# Patient Record
Sex: Female | Born: 1950 | Race: White | Hispanic: No | Marital: Married | Smoking: Never smoker
Health system: Southern US, Community
[De-identification: ages and names within clinical notes are randomized; demographics above are authoritative.]

## PROBLEM LIST (undated history)

## (undated) DIAGNOSIS — E079 Disorder of thyroid, unspecified: Secondary | ICD-10-CM

## (undated) DIAGNOSIS — M069 Rheumatoid arthritis, unspecified: Secondary | ICD-10-CM

## (undated) DIAGNOSIS — M797 Fibromyalgia: Secondary | ICD-10-CM

## (undated) HISTORY — PX: TONSILLECTOMY: SUR1361

## (undated) HISTORY — PX: REPLACEMENT TOTAL KNEE: SUR1224

## (undated) HISTORY — PX: ABDOMINAL HYSTERECTOMY: SHX81

## (undated) HISTORY — PX: TYMPANOSTOMY TUBE PLACEMENT: SHX32

## (undated) HISTORY — DX: Disorder of thyroid, unspecified: E07.9

## (undated) HISTORY — PX: CHOLECYSTECTOMY: SHX55

---

## 2015-09-13 ENCOUNTER — Encounter: Payer: Self-pay | Admitting: Family Medicine

## 2015-09-13 ENCOUNTER — Ambulatory Visit (INDEPENDENT_AMBULATORY_CARE_PROVIDER_SITE_OTHER): Payer: BLUE CROSS/BLUE SHIELD | Admitting: Family Medicine

## 2015-09-13 VITALS — BP 121/86 | HR 67 | Wt 218.0 lb

## 2015-09-13 DIAGNOSIS — Z7952 Long term (current) use of systemic steroids: Secondary | ICD-10-CM | POA: Insufficient documentation

## 2015-09-13 DIAGNOSIS — D848 Other specified immunodeficiencies: Secondary | ICD-10-CM

## 2015-09-13 DIAGNOSIS — Z23 Encounter for immunization: Secondary | ICD-10-CM

## 2015-09-13 DIAGNOSIS — M069 Rheumatoid arthritis, unspecified: Secondary | ICD-10-CM | POA: Diagnosis not present

## 2015-09-13 DIAGNOSIS — M797 Fibromyalgia: Secondary | ICD-10-CM

## 2015-09-13 DIAGNOSIS — E538 Deficiency of other specified B group vitamins: Secondary | ICD-10-CM | POA: Insufficient documentation

## 2015-09-13 DIAGNOSIS — Z90710 Acquired absence of both cervix and uterus: Secondary | ICD-10-CM

## 2015-09-13 DIAGNOSIS — Z780 Asymptomatic menopausal state: Secondary | ICD-10-CM | POA: Diagnosis not present

## 2015-09-13 DIAGNOSIS — T380X5A Adverse effect of glucocorticoids and synthetic analogues, initial encounter: Secondary | ICD-10-CM | POA: Insufficient documentation

## 2015-09-13 DIAGNOSIS — E669 Obesity, unspecified: Secondary | ICD-10-CM | POA: Diagnosis not present

## 2015-09-13 DIAGNOSIS — E039 Hypothyroidism, unspecified: Secondary | ICD-10-CM

## 2015-09-13 NOTE — Assessment & Plan Note (Signed)
Continue current regimen is as guided by rheumatology

## 2015-09-13 NOTE — Assessment & Plan Note (Signed)
Continue current regimen

## 2015-09-13 NOTE — Assessment & Plan Note (Signed)
Check B12 

## 2015-09-13 NOTE — Assessment & Plan Note (Signed)
Bone density screening ordered

## 2015-09-13 NOTE — Assessment & Plan Note (Signed)
Check TSH 

## 2015-09-13 NOTE — Progress Notes (Signed)
Christina Ayala is a 64 y.o. female who presents to [facility]: [facility]  today for establish care and discuss chronic issues. Despite her multiple issues listed below [name] feels well and has no active medical problems today.  1) rheumatoid arthritis: Patient has chronic rheumatoid arthritis controlled with the below regimen. She receives all of her medications listed below per rheumatology and [facility]. She feels reasonably well controlled with minimal flares.  2) fibromyalgia: Patient is febrile mouth is typically well controlled with the regimen listed below. She denies significant fibromyalgia flares.  3) hypothyroidism: Well controlled with Synthroid  4) vitamin B-12 deficiency currently controlled  5) obesity: Due to chronic steroids. Patient is attempting to exercise.  Health maintenance:  Patient is due for Tdap vaccine today Patient states that she cannot take influenza or varicella vaccines as she is allergic to eggs. Additionally patient declines mammograms because she finds them uncomfortable and is not interested in this screening test at this time. She is interested in bone density screening however.   Past Medical History  Diagnosis Date  . Thyroid disease    History reviewed. No pertinent past surgical history. Social History  Substance Use Topics  . Smoking status: Never Smoker   . Smokeless tobacco: Not on file  . Alcohol Use: 0.0 oz/week    0 Standard drinks or equivalent per week   family history includes Depression in her brother; Diabetes in her maternal uncle and paternal aunt.  ROS as above Medications: Current Outpatient Prescriptions  Medication Sig Dispense Refill  . Certolizumab Pegol (CIMZIA PREFILLED) 2 X 200 MG/ML KIT Inject 400 mg into the skin.    . DULoxetine (CYMBALTA) 30 MG capsule Take 60 mg by mouth.    . folic acid (FOLVITE) 1 MG tablet Take 1 mg by mouth.    Marland Kitchen HYDROcodone-acetaminophen (NORCO/VICODIN)  5-325 MG tablet Take 0.5 tablets by mouth.    . hydroxychloroquine (PLAQUENIL) 200 MG tablet Take 200 mg by mouth.    . leflunomide (ARAVA) 20 MG tablet Take 20 mg by mouth.    . levothyroxine (SYNTHROID, LEVOTHROID) 175 MCG tablet Take 175 mcg by mouth.    . methylPREDNISolone (MEDROL) 2 MG tablet Take 2 mg by mouth.    . Multiple Vitamins-Minerals (MULTIVITAMIN WITH MINERALS) tablet Take 1 tablet by mouth.    . traMADol (ULTRAM) 50 MG tablet Take 25 mg by mouth.    . traZODone (DESYREL) 100 MG tablet Take 100 mg by mouth.    . vitamin B-12 (CYANOCOBALAMIN) 500 MCG tablet Take 500 mcg by mouth.     No current facility-administered medications for this visit.   Allergies  Allergen Reactions  . Eggs Or Egg-Derived Products   . Penicillins Nausea And Vomiting    Can take keflex  . Percocet [Oxycodone-Acetaminophen] Other (See Comments)    Mood changes, vision changes.   . Sulfa Antibiotics Nausea And Vomiting  . Erythromycin Base Nausea Only     Exam:  BP 121/86 mmHg  Pulse 67  Wt 218 lb (98.884 kg) Gen: Well NAD obese HEENT: EOMI,  MMM Lungs: Normal work of breathing. CTABL Heart: RRR no MRG Abd: NABS, Soft. Nondistended, Nontender Exts: Brisk capillary refill, warm and well perfused.  MSK: No hand ulnar deviation or significant synovitis.  No results found for this or any previous visit (from the past 24 hour(s)). No results found.   Please see individual assessment and plan sections.

## 2015-09-13 NOTE — Assessment & Plan Note (Signed)
Likely due to steroids. Work on weight loss at future visits.

## 2015-09-13 NOTE — Patient Instructions (Signed)
Thank you for coming in today. Call or go to the emergency room if you get worse, have trouble breathing, have chest pains, or palpitations.  My staff will call with results.

## 2015-09-13 NOTE — Assessment & Plan Note (Signed)
Not a candidate for Pap smears

## 2015-09-14 ENCOUNTER — Ambulatory Visit (INDEPENDENT_AMBULATORY_CARE_PROVIDER_SITE_OTHER): Payer: BLUE CROSS/BLUE SHIELD

## 2015-09-14 DIAGNOSIS — M8580 Other specified disorders of bone density and structure, unspecified site: Secondary | ICD-10-CM | POA: Diagnosis not present

## 2015-09-14 DIAGNOSIS — Z780 Asymptomatic menopausal state: Secondary | ICD-10-CM

## 2015-09-15 ENCOUNTER — Encounter: Payer: Self-pay | Admitting: Family Medicine

## 2015-09-15 DIAGNOSIS — M8580 Other specified disorders of bone density and structure, unspecified site: Secondary | ICD-10-CM | POA: Insufficient documentation

## 2015-09-15 LAB — LIPID PANEL
CHOLESTEROL: 175 mg/dL (ref 125–200)
HDL: 46 mg/dL (ref >=46)
LDL CALC: 83 mg/dL (ref <130)
Total CHOL/HDL Ratio: 3.8 Ratio (ref <=5.0)
Triglycerides: 229 mg/dL — ABNORMAL HIGH (ref <150)
VLDL: 46 mg/dL — AB (ref <30)

## 2015-09-15 LAB — TSH: TSH: 2.08 u[IU]/mL (ref 0.350–4.500)

## 2015-09-15 LAB — VITAMIN B12: VITAMIN B 12: 1410 pg/mL — AB (ref 211–911)

## 2015-09-15 NOTE — Progress Notes (Signed)
Quick Note:  Bone density test shows osteopenia this is a precursor of osteoporosis. Take OTC vitD and Calcium daily to prevent osteoporosis. ______

## 2015-09-16 LAB — VITAMIN D 25 HYDROXY (VIT D DEFICIENCY, FRACTURES): VIT D 25 HYDROXY: 19 ng/mL — AB (ref 30–100)

## 2015-09-18 NOTE — Progress Notes (Signed)
Quick Note:  1) Vit D is low. Will send in a Rx for Vit D pills.  Cholesterol is mildly high. Work on diet and exercise. ______

## 2015-09-20 MED ORDER — VITAMIN D (ERGOCALCIFEROL) 1.25 MG (50000 UNIT) PO CAPS: 50000.0000 [IU] | ORAL_CAPSULE | ORAL | Status: DC

## 2015-09-20 NOTE — Addendum Note (Signed)
Addended by: Minna Antis T on: 09/20/2015 08:52 AM   Modules accepted: Orders

## 2015-10-26 ENCOUNTER — Ambulatory Visit (INDEPENDENT_AMBULATORY_CARE_PROVIDER_SITE_OTHER): Payer: BLUE CROSS/BLUE SHIELD | Admitting: Family Medicine

## 2015-10-26 ENCOUNTER — Encounter: Payer: Self-pay | Admitting: Family Medicine

## 2015-10-26 VITALS — BP 139/69 | HR 76 | Temp 98.1°F | Wt 228.0 lb

## 2015-10-26 DIAGNOSIS — T380X1A Poisoning by glucocorticoids and synthetic analogues, accidental (unintentional), initial encounter: Secondary | ICD-10-CM

## 2015-10-26 DIAGNOSIS — J069 Acute upper respiratory infection, unspecified: Secondary | ICD-10-CM | POA: Diagnosis not present

## 2015-10-26 DIAGNOSIS — D848 Other specified immunodeficiencies: Secondary | ICD-10-CM | POA: Diagnosis not present

## 2015-10-26 DIAGNOSIS — Z7952 Long term (current) use of systemic steroids: Secondary | ICD-10-CM

## 2015-10-26 MED ORDER — FLUTICASONE PROPIONATE 50 MCG/ACT NA SUSP: 2.0000 | Freq: Every day | NASAL | Status: DC

## 2015-10-26 MED ORDER — IPRATROPIUM BROMIDE 0.06 % NA SOLN: 2.0000 | Freq: Four times a day (QID) | NASAL | Status: DC

## 2015-10-26 NOTE — Assessment & Plan Note (Signed)
Symptoms most likely related to viral URI with some eustachian tube dysfunction. Given her history and relative immunocompromise status will obtain CBC and metabolic panel. Additionally we'll obtain Epstein-Barr antibody and PCR panel. Treat empirically with Tylenol, Flonase nasal spray and Atrovent nasal spray. Return as needed.

## 2015-10-26 NOTE — Progress Notes (Signed)
Christina Ayala is a 64 y.o. female who presents to [facility]: [facility] today for cough congestion and runny nose and ear pain. Symptoms present for 2 weeks. Aside from the ear pain her symptoms are improving. Ear pain and pressure started a few days ago and her mild to moderate. She had initial fevers and chills which have since resolved. No vomiting or diarrhea. She has a history of rheumatoid arthritis currently managed with Plaquenil, and methylprednisolone, methotrexate as well as other anti-rheumatics. 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 She states that she's had mononucleosis in the past with recurrent flareup episodes most recently last year. She's concerned that her current symptoms are due to a recurrence of mononucleosis.   Past Medical History  Diagnosis Date  . Thyroid disease    No past surgical history on file. Social History  Substance Use Topics  . Smoking status: Never Smoker   . Smokeless tobacco: Not on file  . Alcohol Use: 0.0 oz/week    0 Standard drinks or equivalent per week   family history includes Depression in her brother; Diabetes in her maternal uncle and paternal aunt.  ROS as above Medications: Current Outpatient Prescriptions  Medication Sig Dispense Refill  . Certolizumab Pegol (CIMZIA PREFILLED) 2 X 200 MG/ML KIT Inject 400 mg into the skin.    . DULoxetine (CYMBALTA) 30 MG capsule Take 60 mg by mouth.    . folic acid (FOLVITE) 1 MG tablet Take 1 mg by mouth.    Marland Kitchen HYDROcodone-acetaminophen (NORCO/VICODIN) 5-325 MG tablet Take 0.5 tablets by mouth.    . hydroxychloroquine (PLAQUENIL) 200 MG tablet Take 200 mg by mouth.    . leflunomide (ARAVA) 20 MG tablet Take 20 mg by mouth.    . levothyroxine (SYNTHROID, LEVOTHROID) 175 MCG tablet Take 175 mcg by mouth.    . methylPREDNISolone (MEDROL) 2 MG tablet Take 2 mg by mouth.    . Multiple Vitamins-Minerals  (MULTIVITAMIN WITH MINERALS) tablet Take 1 tablet by mouth.    . traMADol (ULTRAM) 50 MG tablet Take 25 mg by mouth.    . traZODone (DESYREL) 100 MG tablet Take 100 mg by mouth.    . vitamin B-12 (CYANOCOBALAMIN) 500 MCG tablet Take 500 mcg by mouth.    . Vitamin D, Ergocalciferol, (DRISDOL) 50000 UNITS CAPS capsule Take 1 capsule (50,000 Units total) by mouth every 7 (seven) days. 12 capsule 0  . fluticasone (FLONASE) 50 MCG/ACT nasal spray Place 2 sprays into both nostrils daily. 16 g 2  . ipratropium (ATROVENT) 0.06 % nasal spray Place 2 sprays into both nostrils 4 (four) times daily. 15 mL 1   No current facility-administered medications for this visit.   Allergies  Allergen Reactions  . Eggs Or Egg-Derived Products   . Penicillins Nausea And Vomiting    Can take keflex  . Percocet [Oxycodone-Acetaminophen] Other (See Comments)    Mood changes, vision changes.   . Sulfa Antibiotics Nausea And Vomiting  . Erythromycin Base Nausea Only     Exam:  BP 139/69 mmHg  Pulse 76  Temp(Src) 98.1 F (36.7 C) (Oral)  Wt 228 lb (103.42 kg)  SpO2 100% Gen: Well NAD nontoxic-appearing woman HEENT: EOMI,  MMM posterior pharynx with mild cobblestoning. Normal nasal turbinates. Tympanic membranes are retracted bilaterally but not erythematous. Mastoids are nontender. No significant cervical lymphadenopathy present. Lungs: Normal work of breathing. CTABL Heart: RRR no MRG Abd: NABS, Soft. Nondistended, Nontender Exts: Brisk capillary refill, warm and  well perfused.   No results found for this or any previous visit (from the past 24 hour(s)). No results found.   Please see individual assessment and plan sections.

## 2015-10-26 NOTE — Patient Instructions (Signed)
Thank you for coming in today. Call or go to the emergency room if you get worse, have trouble breathing, have chest pains, or palpitations.  We will do blood work.  Use atrovent spray 4x daily for post nasal drip.  Use flonase spray 1-2 x daily for ear pressure.  Call or go to the emergency room if you get worse, have trouble breathing, have chest pains, or palpitations.

## 2015-10-27 LAB — CBC WITH DIFFERENTIAL/PLATELET
Basophils Absolute: 0.1 10*3/uL (ref 0.0–0.1)
Basophils Relative: 1 % (ref 0–1)
EOS ABS: 0.2 10*3/uL (ref 0.0–0.7)
EOS PCT: 2 % (ref 0–5)
HEMATOCRIT: 37.2 % (ref 36.0–46.0)
Hemoglobin: 12.3 g/dL (ref 12.0–15.0)
LYMPHS ABS: 1.9 10*3/uL (ref 0.7–4.0)
LYMPHS PCT: 25 % (ref 12–46)
MCH: 32.5 pg (ref 26.0–34.0)
MCHC: 33.1 g/dL (ref 30.0–36.0)
MCV: 98.4 fL (ref 78.0–100.0)
MONOS PCT: 11 % (ref 3–12)
MPV: 11.4 fL (ref 8.6–12.4)
Monocytes Absolute: 0.8 10*3/uL (ref 0.1–1.0)
Neutro Abs: 4.6 10*3/uL (ref 1.7–7.7)
Neutrophils Relative %: 61 % (ref 43–77)
PLATELETS: 205 10*3/uL (ref 150–400)
RBC: 3.78 MIL/uL — ABNORMAL LOW (ref 3.87–5.11)
RDW: 13.8 % (ref 11.5–15.5)
WBC: 7.5 10*3/uL (ref 4.0–10.5)

## 2015-10-27 LAB — COMPREHENSIVE METABOLIC PANEL
ALK PHOS: 61 U/L (ref 33–130)
ALT: 18 U/L (ref 6–29)
AST: 22 U/L (ref 10–35)
Albumin: 3.2 g/dL — ABNORMAL LOW (ref 3.6–5.1)
BUN: 20 mg/dL (ref 7–25)
CALCIUM: 8.2 mg/dL — AB (ref 8.6–10.4)
CHLORIDE: 107 mmol/L (ref 98–110)
CO2: 22 mmol/L (ref 20–31)
Creat: 0.69 mg/dL (ref 0.50–0.99)
Glucose, Bld: 93 mg/dL (ref 65–99)
POTASSIUM: 3.9 mmol/L (ref 3.5–5.3)
Sodium: 139 mmol/L (ref 135–146)
TOTAL PROTEIN: 5.4 g/dL — AB (ref 6.1–8.1)
Total Bilirubin: 0.3 mg/dL (ref 0.2–1.2)

## 2015-10-27 LAB — EPSTEIN-BARR VIRUS VCA ANTIBODY PANEL
EBV EA IGG: 17.7 U/mL — AB (ref <9.0)
EBV NA IgG: 135 U/mL — ABNORMAL HIGH (ref <18.0)
EBV VCA IGM: 10.7 U/mL (ref <36.0)
EBV VCA IgG: 92.4 U/mL — ABNORMAL HIGH (ref <18.0)

## 2015-10-30 LAB — EPSTEIN BARR VIRUS DNA, QUANT RTPCR

## 2015-10-30 NOTE — Progress Notes (Signed)
Quick Note:  No evidence of recurrent EBV/Mono infection. ______

## 2016-01-15 DIAGNOSIS — Z79899 Other long term (current) drug therapy: Secondary | ICD-10-CM | POA: Insufficient documentation

## 2016-02-26 ENCOUNTER — Ambulatory Visit (INDEPENDENT_AMBULATORY_CARE_PROVIDER_SITE_OTHER): Payer: BLUE CROSS/BLUE SHIELD | Admitting: Family Medicine

## 2016-02-26 ENCOUNTER — Encounter: Payer: Self-pay | Admitting: Family Medicine

## 2016-02-26 VITALS — BP 143/86 | HR 77 | Wt 232.0 lb

## 2016-02-26 DIAGNOSIS — M069 Rheumatoid arthritis, unspecified: Secondary | ICD-10-CM | POA: Diagnosis not present

## 2016-02-26 MED ORDER — METHYLPREDNISOLONE 2 MG PO TABS: 1.0000 mg | ORAL_TABLET | Freq: Every day | ORAL | Status: DC

## 2016-02-26 NOTE — Assessment & Plan Note (Signed)
Rheumatoid arthritis flare. Refill Medrol. Refer to [facility] rheumatology

## 2016-02-26 NOTE — Patient Instructions (Signed)
Thank you for coming in today. Restart Medrol.  You should hear from Rheumatology soon.  Return as needed.

## 2016-02-26 NOTE — Progress Notes (Signed)
       Christina Ayala is a 65 y.o. female who presents to [facility]: [facility] today for rheumatoid arthritis flare. Patient has a history of rheumatoid arthritis typically well controlled with Cimza Plaquenil and Medrol. Her existing rheumatologist left the practice and she was given a new rheumatologist. Patient notes that her Medrol was discontinued somewhat abruptly. Since then her symptoms have worsened. She notes pain in both shoulders both hands and the left knee. She would like a referral to a different rheumatology practice as she does not feel she was treated well.   Past Medical History  Diagnosis Date  . Thyroid disease    No past surgical history on file. Social History  Substance Use Topics  . Smoking status: Never Smoker   . Smokeless tobacco: Not on file  . Alcohol Use: 0.0 oz/week    0 Standard drinks or equivalent per week   family history includes Depression in her brother; Diabetes in her maternal uncle and paternal aunt.  ROS as above Medications: Current Outpatient Prescriptions  Medication Sig Dispense Refill  . Certolizumab Pegol (CIMZIA PREFILLED) 2 X 200 MG/ML KIT Inject 400 mg into the skin.    . DULoxetine (CYMBALTA) 30 MG capsule Take 60 mg by mouth.    . fluticasone (FLONASE) 50 MCG/ACT nasal spray Place 2 sprays into both nostrils daily. 16 g 2  . folic acid (FOLVITE) 1 MG tablet Take 1 mg by mouth.    Marland Kitchen HYDROcodone-acetaminophen (NORCO/VICODIN) 5-325 MG tablet Take 0.5 tablets by mouth.    . hydroxychloroquine (PLAQUENIL) 200 MG tablet Take 200 mg by mouth.    Marland Kitchen ipratropium (ATROVENT) 0.06 % nasal spray Place 2 sprays into both nostrils 4 (four) times daily. 15 mL 1  . leflunomide (ARAVA) 20 MG tablet Take 20 mg by mouth.    . levothyroxine (SYNTHROID, LEVOTHROID) 175 MCG tablet Take 175 mcg by mouth.    . methylPREDNISolone (MEDROL) 2 MG tablet Take 0.5 tablets (1  mg total) by mouth daily. 45 tablet 0  . Multiple Vitamins-Minerals (MULTIVITAMIN WITH MINERALS) tablet Take 1 tablet by mouth.    . traMADol (ULTRAM) 50 MG tablet Take 25 mg by mouth.    . traZODone (DESYREL) 100 MG tablet Take 100 mg by mouth.    . vitamin B-12 (CYANOCOBALAMIN) 500 MCG tablet Take 500 mcg by mouth.    . Vitamin D, Ergocalciferol, (DRISDOL) 50000 UNITS CAPS capsule Take 1 capsule (50,000 Units total) by mouth every 7 (seven) days. 12 capsule 0   No current facility-administered medications for this visit.   Allergies  Allergen Reactions  . Eggs Or Egg-Derived Products   . Penicillins Nausea And Vomiting    Can take keflex  . Percocet [Oxycodone-Acetaminophen] Other (See Comments)    Mood changes, vision changes.   . Sulfa Antibiotics Nausea And Vomiting  . Erythromycin Base Nausea Only     Exam:  BP 143/86 mmHg  Pulse 77  Wt 232 lb (105.235 kg) Gen: Well NAD HEENT: EOMI,  MMM Lungs: Normal work of breathing. CTABL Heart: RRR no MRG Abd: NABS, Soft. Nondistended, Nontender Exts: Brisk capillary refill, warm and well perfused.  Hands bilaterally have synovitis at the MCPs and are tender.  No results found for this or any previous visit (from the past 24 hour(s)). No results found.   Please see individual assessment and plan sections.

## 2016-03-26 DIAGNOSIS — M0609 Rheumatoid arthritis without rheumatoid factor, multiple sites: Secondary | ICD-10-CM | POA: Diagnosis not present

## 2016-03-26 DIAGNOSIS — E038 Other specified hypothyroidism: Secondary | ICD-10-CM | POA: Diagnosis not present

## 2016-03-26 LAB — CYCLIC CITRUL PEPTIDE ANTIBODY, IGG: CCP Antibodies IgG/IgA: 8

## 2016-03-26 LAB — CBC AND DIFFERENTIAL
HEMOGLOBIN: 12.4 g/dL (ref 12.0–16.0)
PLATELETS: 203 10*3/uL (ref 150–399)
WBC: 6.8 10^3/mL

## 2016-03-26 LAB — BASIC METABOLIC PANEL
Creatinine: 0.7 mg/dL (ref 0.5–1.1)
GLUCOSE: 92 mg/dL

## 2016-03-26 LAB — LATEX, IGE: LATEX: 10

## 2016-03-26 LAB — C-REACTIVE PROTEIN, QUANT: CRP: 8.4

## 2016-03-26 LAB — HEPATIC FUNCTION PANEL
ALT: 20 U/L (ref 7–35)
AST: 27 U/L (ref 13–35)

## 2016-04-02 ENCOUNTER — Encounter: Payer: Self-pay | Admitting: Family Medicine

## 2016-04-02 ENCOUNTER — Ambulatory Visit (INDEPENDENT_AMBULATORY_CARE_PROVIDER_SITE_OTHER): Payer: BLUE CROSS/BLUE SHIELD

## 2016-04-02 ENCOUNTER — Ambulatory Visit (INDEPENDENT_AMBULATORY_CARE_PROVIDER_SITE_OTHER): Payer: BLUE CROSS/BLUE SHIELD | Admitting: Family Medicine

## 2016-04-02 VITALS — BP 160/89 | HR 77 | Wt 225.0 lb

## 2016-04-02 DIAGNOSIS — Z96651 Presence of right artificial knee joint: Secondary | ICD-10-CM

## 2016-04-02 DIAGNOSIS — M25562 Pain in left knee: Secondary | ICD-10-CM

## 2016-04-02 DIAGNOSIS — M25561 Pain in right knee: Secondary | ICD-10-CM | POA: Diagnosis not present

## 2016-04-02 NOTE — Patient Instructions (Signed)
Thank you for coming in today. For a medicare well visit after your birthday.   Call or go to the ER if you develop a large red swollen joint with extreme pain or oozing puss.

## 2016-04-02 NOTE — Progress Notes (Signed)
Christina Ayala is a 65 y.o. female who presents to [facility]: [place] today for left knee pain. Patient has had gradual onset of left anterior posterior knee pain for the last week or so. She feels that her kneecap is subluxing laterally at times. She denies any falls or specific injury. No fevers or chills nausea vomiting or diarrhea. Her knee pain is complicated by rheumatoid arthritis and fibromyalgia. She has tried some over the counter medicines which did not help much.   Past Medical History  Diagnosis Date  . Thyroid disease    No past surgical history on file. Social History  Substance Use Topics  . Smoking status: Never Smoker   . Smokeless tobacco: Not on file  . Alcohol Use: 0.0 oz/week    0 Standard drinks or equivalent per week   family history includes Depression in her brother; Diabetes in her maternal uncle and paternal aunt.  ROS as above:  Medications: Current Outpatient Prescriptions  Medication Sig Dispense Refill  . Certolizumab Pegol (CIMZIA PREFILLED) 2 X 200 MG/ML KIT Inject 400 mg into the skin.    . DULoxetine (CYMBALTA) 30 MG capsule Take 60 mg by mouth.    . fluticasone (FLONASE) 50 MCG/ACT nasal spray Place 2 sprays into both nostrils daily. 16 g 2  . folic acid (FOLVITE) 1 MG tablet Take 1 mg by mouth.    Marland Kitchen HYDROcodone-acetaminophen (NORCO/VICODIN) 5-325 MG tablet Take 0.5 tablets by mouth.    . hydroxychloroquine (PLAQUENIL) 200 MG tablet Take 200 mg by mouth.    . levothyroxine (SYNTHROID, LEVOTHROID) 175 MCG tablet Take 175 mcg by mouth.    . methylPREDNISolone (MEDROL) 2 MG tablet Take 0.5 tablets (1 mg total) by mouth daily. 45 tablet 0  . Multiple Vitamins-Minerals (MULTIVITAMIN WITH MINERALS) tablet Take 1 tablet by mouth.    . traMADol (ULTRAM) 50 MG tablet Take 25 mg by mouth.    . traZODone (DESYREL) 100 MG tablet Take 100 mg by  mouth.    . vitamin B-12 (CYANOCOBALAMIN) 500 MCG tablet Take 500 mcg by mouth.     No current facility-administered medications for this visit.   Allergies  Allergen Reactions  . Eggs Or Egg-Derived Products   . Penicillins Nausea And Vomiting    Can take keflex  . Percocet [Oxycodone-Acetaminophen] Other (See Comments)    Mood changes, vision changes.   . Sulfa Antibiotics Nausea And Vomiting  . Erythromycin Base Nausea Only     Exam:  BP 160/89 mmHg  Pulse 77  Wt 225 lb (102.059 kg) Gen: Well NAD HEENT: EOMI,  MMM  Knee: Mild effusion present otherwise normal-appearing Normal motion but pain with extension. 1+ retropatellar crepitations. Neck slight diffusely tender. Stable ligaments exam. No patellar subluxation on exam.  Procedure: Real-time Ultrasound Guided Injection of Left Knee  Device: GE Logiq E  Images permanently stored and available for review in the ultrasound unit. Verbal informed consent obtained. Discussed risks and benefits of procedure. Warned about infection bleeding damage to structures skin hypopigmentation and fat atrophy among others. Patient expresses understanding and agreement Time-out conducted.  Noted no overlying erythema, induration, or other signs of local infection.  Skin prepped in a sterile fashion.  Local anesthesia: Topical Ethyl chloride.  With sterile technique and under real time ultrasound guidance: 63m Kenalog and 4 mL of Marcaine injected easily.  Completed without difficulty  Pain immediately resolved suggesting accurate placement of the medication.  Advised  to call if fevers/chills, erythema, induration, drainage, or persistent bleeding.  Images permanently stored and available for review in the ultrasound unit.  Impression: Technically successful ultrasound guided injection.    No results found for this or any previous visit (from the past 24 hour(s)). Dg Knee 1-2 Views Right  04/02/2016  CLINICAL DATA:  Left  knee pain and weakness for months. History of right knee replacement. EXAM: RIGHT KNEE - 1-2 VIEW COMPARISON:  Left knee performed today. FINDINGS: Changes of right knee replacement noted on the this AP view of the right knee. No visible hardware complicating feature or acute bony abnormality. Cannot assess for a joint effusion without the lateral view. : Right knee replacement. No visible complicating feature or acute bony abnormality. Electronically Signed   By: Rolm Baptise M.D.   On: 04/02/2016 14:34   Dg Knee Complete 4 Views Left  04/02/2016  CLINICAL DATA:  Left knee pain, weakness for months. Worsening the past few days. EXAM: LEFT KNEE - COMPLETE 4+ VIEW COMPARISON:  None. FINDINGS: There is joint space narrowing in the medial and patellofemoral compartments. Spurring in all 3 compartments. No joint effusion. No acute bony abnormality. Specifically, no fracture, subluxation, or dislocation. Soft tissues are intact. IMPRESSION: Mild-to-moderate degenerative changes, most pronounced in the medial and patellofemoral compartments. No acute bony abnormality. Electronically Signed   By: Rolm Baptise M.D.   On: 04/02/2016 14:34      Assessment and Plan: 65 y.o. 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 Dramatic improvement with steroid and Marcaine injection. Return in the near future for a Medicare wellness visit.  Discussed warning signs or symptoms. Please see discharge instructions. Patient expresses understanding.

## 2016-04-02 NOTE — Progress Notes (Signed)
Quick Note:  x-ray shows arthritis ______

## 2016-04-19 ENCOUNTER — Encounter: Payer: Self-pay | Admitting: Family Medicine

## 2016-04-30 DIAGNOSIS — M0609 Rheumatoid arthritis without rheumatoid factor, multiple sites: Secondary | ICD-10-CM | POA: Diagnosis not present

## 2016-05-06 ENCOUNTER — Telehealth: Payer: Self-pay | Admitting: *Deleted

## 2016-05-06 MED ORDER — LEVOTHYROXINE SODIUM 175 MCG PO TABS: 175.0000 ug | ORAL_TABLET | Freq: Every day | ORAL | Status: DC

## 2016-05-06 NOTE — Telephone Encounter (Signed)
Patient's spouse called requesting a new rx. Since this office has not yet rx'd this medication for this patient will have to rout this to the provider

## 2016-05-06 NOTE — Telephone Encounter (Signed)
TSH normal at last check refill levothyroxine.

## 2016-05-06 NOTE — Telephone Encounter (Signed)
Pt notified about rx

## 2016-05-30 DIAGNOSIS — M0609 Rheumatoid arthritis without rheumatoid factor, multiple sites: Secondary | ICD-10-CM | POA: Diagnosis not present

## 2016-05-30 DIAGNOSIS — M8580 Other specified disorders of bone density and structure, unspecified site: Secondary | ICD-10-CM | POA: Diagnosis not present

## 2016-05-30 DIAGNOSIS — M797 Fibromyalgia: Secondary | ICD-10-CM | POA: Diagnosis not present

## 2016-05-30 DIAGNOSIS — M150 Primary generalized (osteo)arthritis: Secondary | ICD-10-CM | POA: Diagnosis not present

## 2016-05-30 DIAGNOSIS — E038 Other specified hypothyroidism: Secondary | ICD-10-CM | POA: Diagnosis not present

## 2016-05-31 ENCOUNTER — Encounter: Payer: Self-pay | Admitting: Family Medicine

## 2016-05-31 DIAGNOSIS — M0609 Rheumatoid arthritis without rheumatoid factor, multiple sites: Secondary | ICD-10-CM | POA: Diagnosis not present

## 2016-06-28 DIAGNOSIS — M47812 Spondylosis without myelopathy or radiculopathy, cervical region: Secondary | ICD-10-CM | POA: Diagnosis not present

## 2016-06-28 DIAGNOSIS — G44209 Tension-type headache, unspecified, not intractable: Secondary | ICD-10-CM | POA: Diagnosis not present

## 2016-06-28 DIAGNOSIS — M62830 Muscle spasm of back: Secondary | ICD-10-CM | POA: Diagnosis not present

## 2016-06-28 DIAGNOSIS — M545 Low back pain: Secondary | ICD-10-CM | POA: Diagnosis not present

## 2016-07-02 DIAGNOSIS — M0609 Rheumatoid arthritis without rheumatoid factor, multiple sites: Secondary | ICD-10-CM | POA: Diagnosis not present

## 2016-07-04 ENCOUNTER — Encounter: Payer: Self-pay | Admitting: Family Medicine

## 2016-07-10 DIAGNOSIS — M8580 Other specified disorders of bone density and structure, unspecified site: Secondary | ICD-10-CM | POA: Diagnosis not present

## 2016-07-10 DIAGNOSIS — M0609 Rheumatoid arthritis without rheumatoid factor, multiple sites: Secondary | ICD-10-CM | POA: Diagnosis not present

## 2016-07-10 DIAGNOSIS — M797 Fibromyalgia: Secondary | ICD-10-CM | POA: Diagnosis not present

## 2016-07-10 DIAGNOSIS — M150 Primary generalized (osteo)arthritis: Secondary | ICD-10-CM | POA: Diagnosis not present

## 2016-08-02 DIAGNOSIS — M8580 Other specified disorders of bone density and structure, unspecified site: Secondary | ICD-10-CM | POA: Diagnosis not present

## 2016-08-02 DIAGNOSIS — M150 Primary generalized (osteo)arthritis: Secondary | ICD-10-CM | POA: Diagnosis not present

## 2016-08-02 DIAGNOSIS — M797 Fibromyalgia: Secondary | ICD-10-CM | POA: Diagnosis not present

## 2016-08-02 DIAGNOSIS — M0609 Rheumatoid arthritis without rheumatoid factor, multiple sites: Secondary | ICD-10-CM | POA: Diagnosis not present

## 2016-08-05 DIAGNOSIS — M0609 Rheumatoid arthritis without rheumatoid factor, multiple sites: Secondary | ICD-10-CM | POA: Diagnosis not present

## 2016-09-02 DIAGNOSIS — M0609 Rheumatoid arthritis without rheumatoid factor, multiple sites: Secondary | ICD-10-CM | POA: Diagnosis not present

## 2016-09-03 ENCOUNTER — Ambulatory Visit (INDEPENDENT_AMBULATORY_CARE_PROVIDER_SITE_OTHER): Payer: Medicare Other | Admitting: Family Medicine

## 2016-09-03 ENCOUNTER — Encounter: Payer: Self-pay | Admitting: Family Medicine

## 2016-09-03 VITALS — BP 116/52 | HR 89 | Wt 231.0 lb

## 2016-09-03 DIAGNOSIS — E538 Deficiency of other specified B group vitamins: Secondary | ICD-10-CM

## 2016-09-03 DIAGNOSIS — T7808XS Anaphylactic reaction due to eggs, sequela: Secondary | ICD-10-CM

## 2016-09-03 DIAGNOSIS — Z1159 Encounter for screening for other viral diseases: Secondary | ICD-10-CM

## 2016-09-03 DIAGNOSIS — R2689 Other abnormalities of gait and mobility: Secondary | ICD-10-CM | POA: Diagnosis not present

## 2016-09-03 DIAGNOSIS — R945 Abnormal results of liver function studies: Secondary | ICD-10-CM | POA: Diagnosis not present

## 2016-09-03 DIAGNOSIS — Z23 Encounter for immunization: Secondary | ICD-10-CM | POA: Diagnosis not present

## 2016-09-03 DIAGNOSIS — T7808XA Anaphylactic reaction due to eggs, initial encounter: Secondary | ICD-10-CM | POA: Insufficient documentation

## 2016-09-03 DIAGNOSIS — E039 Hypothyroidism, unspecified: Secondary | ICD-10-CM | POA: Diagnosis not present

## 2016-09-03 DIAGNOSIS — E559 Vitamin D deficiency, unspecified: Secondary | ICD-10-CM | POA: Diagnosis not present

## 2016-09-03 MED ORDER — EPINEPHRINE 0.3 MG/0.3ML IJ SOAJ: 0.3000 mg | Freq: Once | INTRAMUSCULAR | 1 refills | Status: AC

## 2016-09-03 MED ORDER — INFLUENZA VAC RECOMB HA (PF) IM SOLN: 0.5000 mL | Freq: Once | INTRAMUSCULAR | 0 refills | Status: AC

## 2016-09-03 NOTE — Patient Instructions (Signed)
Thank you for coming in today. Attend Physical Therapy.  Return in 1 month for follow up.  Get labs soon.  Call or go to the emergency room if you get worse, have trouble breathing, have chest pains, or palpitations.

## 2016-09-03 NOTE — Addendum Note (Signed)
Addended by: Minna Antis T on: 09/03/2016 03:55 PM   Modules accepted: Orders

## 2016-09-03 NOTE — Progress Notes (Signed)
Christina Ayala is a 65 y.o. female who presents to [place] today for discuss impaired balance and mild leg weakness, bruise on left forearm, catch up on health maintenance.  Impaired balance: Patient notes a several month history of subjectively decreased balance. She has not fallen but has had some near misses. She denies any dizziness or lightheadedness fevers or chills nausea or vomiting. She feels well otherwise. She has not changed any of her medications.  Bruise on left forearm: Patient denies any known injury or bug bite. She has a circular ring shaped bruise on her left forearm. It is nontender. She denies any fevers or chills nausea vomiting or diarrhea.  Health maintenance: Patient is due for the pneumonia vaccine, influenza vaccine, hepatitis screening.  She declines mammography.   Past Medical History:  Diagnosis Date  . Thyroid disease    No past surgical history on file. Social History  Substance Use Topics  . Smoking status: Never Smoker  . Smokeless tobacco: Not on file  . Alcohol use 0.0 oz/week   family history includes Depression in her brother; Diabetes in her maternal uncle and paternal aunt.  ROS as above:  Medications: Current Outpatient Prescriptions  Medication Sig Dispense Refill  . Certolizumab Pegol (CIMZIA PREFILLED) 2 X 200 MG/ML KIT Inject 400 mg into the skin.    . DULoxetine (CYMBALTA) 30 MG capsule Take 60 mg by mouth.    . EPINEPHrine 0.3 mg/0.3 mL IJ SOAJ injection Inject 0.3 mLs (0.3 mg total) into the muscle once. 1 Device 1  . fluticasone (FLONASE) 50 MCG/ACT nasal spray Place 2 sprays into both nostrils daily. 16 g 2  . folic acid (FOLVITE) 1 MG tablet Take 1 mg by mouth.    Marland Kitchen HYDROcodone-acetaminophen (NORCO/VICODIN) 5-325 MG tablet Take 0.5 tablets by mouth.    . hydroxychloroquine (PLAQUENIL) 200 MG tablet Take 200  mg by mouth.    . influenza vac recombinant HA trivalent (FLUBLOK) injection Inject 0.5 mLs into the muscle once. 0.5 mL 0  . levothyroxine (SYNTHROID, LEVOTHROID) 175 MCG tablet Take 1 tablet (175 mcg total) by mouth daily before breakfast. 90 tablet 2  . methylPREDNISolone (MEDROL) 2 MG tablet Take 0.5 tablets (1 mg total) by mouth daily. 45 tablet 0  . Multiple Vitamins-Minerals (MULTIVITAMIN WITH MINERALS) tablet Take 1 tablet by mouth.    . traMADol (ULTRAM) 50 MG tablet Take 25 mg by mouth.    . traZODone (DESYREL) 100 MG tablet Take 100 mg by mouth.    . vitamin B-12 (CYANOCOBALAMIN) 500 MCG tablet Take 500 mcg by mouth.     No current facility-administered medications for this visit.    Allergies  Allergen Reactions  . Other Anaphylaxis    portabella and cremini mushrooms.   . Eggs Or Egg-Derived Products   . Penicillins Nausea And Vomiting    Can take keflex  . Percocet [Oxycodone-Acetaminophen] Other (See Comments)    Mood changes, vision changes.   Marland Kitchen Spinach   . Sulfa Antibiotics Nausea And Vomiting  . Erythromycin Base Nausea Only    Health Maintenance Health Maintenance  Topic Date Due  . Hepatitis C Screening  05/09/1951  . HIV Screening  04/21/1966  . MAMMOGRAM  04/21/2001  . PNA vac Low Risk Adult (1 of 2 - PCV13) 04/21/2016  . INFLUENZA VACCINE  09/12/2049 (Originally 06/11/2016)  . ZOSTAVAX  09/12/2049 (Originally 04/22/2011)  . COLONOSCOPY  09/12/2018  . TETANUS/TDAP  09/12/2025  . DEXA SCAN  Completed     Exam:  BP (!) 116/52   Pulse 89   Wt 231 lb (104.8 kg)  Gen: Well NAD HEENT: EOMI,  MMM Lungs: Normal work of breathing. CTABL Heart: RRR no MRG Abd: NABS, Soft. Nondistended, Nontender Exts: Brisk capillary refill, warm and well perfused.  Left forearm: Circular ring shaped ecchymosis left forearm. Nontender. No erythema. Oral abrasion in the center portion of the ecchymotic area. Neuro: Alert and oriented normal speech thought process. Lower  extremity strength and sensation and proprioception are intact. Pulses are intact feet bilaterally.        No results found for this or any previous visit (from the past 72 hour(s)). No results found.    Assessment and Plan: 64 y.o. female with  Impaired balance: Unclear etiology. Plan for laboratory workup listed below along with referral to vestibular physical therapy for further evaluation and balance and strength training. Recheck in 1-2 months.  Bruise left forearm: The patient suffered a small abrasion with bruise to her left forearm. I don't think this reflects anything more serious. Plan for watchful waiting.  Health maintenance: Prevnar 13 given. Prescription for egg free fluid vaccine sent to pharmacy. Hepatitis C screening obtained. Patient declined mammography.  History of egg anaphylaxis: EpiPen prescription refilled.   Orders Placed This Encounter  Procedures  . Hepatitis C antibody  . CBC  . COMPLETE METABOLIC PANEL WITH GFR  . VITAMIN D 25 Hydroxy (Vit-D Deficiency, Fractures)  . Vitamin B12  . TSH  . T4, free  . T3, free  . CK  . Ambulatory referral to Physical Therapy    Referral Priority:   Routine    Referral Type:   Physical Medicine    Referral Reason:   Specialty Services Required    Requested Specialty:   Physical Therapy    Number of Visits Requested:   1    Discussed warning signs or symptoms. Please see discharge instructions. Patient expresses understanding.

## 2016-09-04 ENCOUNTER — Encounter: Payer: Self-pay | Admitting: Family Medicine

## 2016-09-04 DIAGNOSIS — R768 Other specified abnormal immunological findings in serum: Secondary | ICD-10-CM | POA: Insufficient documentation

## 2016-09-04 LAB — CBC
HCT: 39.1 % (ref 35.0–45.0)
Hemoglobin: 12.9 g/dL (ref 11.7–15.5)
MCH: 32.3 pg (ref 27.0–33.0)
MCHC: 33 g/dL (ref 32.0–36.0)
MCV: 97.8 fL (ref 80.0–100.0)
MPV: 12 fL (ref 7.5–12.5)
Platelets: 180 10*3/uL (ref 140–400)
RBC: 4 MIL/uL (ref 3.80–5.10)
RDW: 13.6 % (ref 11.0–15.0)
WBC: 6.3 10*3/uL (ref 3.8–10.8)

## 2016-09-04 LAB — HEPATITIS C ANTIBODY: HCV AB: REACTIVE — AB

## 2016-09-04 LAB — T4, FREE: FREE T4: 1.1 ng/dL (ref 0.8–1.8)

## 2016-09-04 LAB — COMPLETE METABOLIC PANEL WITH GFR
ALBUMIN: 3.5 g/dL — AB (ref 3.6–5.1)
ALK PHOS: 75 U/L (ref 33–130)
ALT: 20 U/L (ref 6–29)
AST: 25 U/L (ref 10–35)
BUN: 18 mg/dL (ref 7–25)
CALCIUM: 8.8 mg/dL (ref 8.6–10.4)
CHLORIDE: 107 mmol/L (ref 98–110)
CO2: 22 mmol/L (ref 20–31)
Creat: 0.72 mg/dL (ref 0.50–0.99)
GFR, EST NON AFRICAN AMERICAN: 88 mL/min (ref >=60)
Glucose, Bld: 101 mg/dL — ABNORMAL HIGH (ref 65–99)
POTASSIUM: 4 mmol/L (ref 3.5–5.3)
Sodium: 140 mmol/L (ref 135–146)
Total Bilirubin: 0.3 mg/dL (ref 0.2–1.2)
Total Protein: 5.7 g/dL — ABNORMAL LOW (ref 6.1–8.1)

## 2016-09-04 LAB — CK: Total CK: 93 U/L (ref 7–177)

## 2016-09-04 LAB — VITAMIN D 25 HYDROXY (VIT D DEFICIENCY, FRACTURES): VIT D 25 HYDROXY: 20 ng/mL — AB (ref 30–100)

## 2016-09-04 LAB — T3, FREE: T3 FREE: 2.8 pg/mL (ref 2.3–4.2)

## 2016-09-04 LAB — TSH: TSH: 0.26 m[IU]/L — AB

## 2016-09-04 LAB — VITAMIN B12: VITAMIN B 12: 1167 pg/mL — AB (ref 200–1100)

## 2016-09-06 LAB — HEPATITIS C RNA QUANTITATIVE: HCV QUANT: NOT DETECTED [IU]/mL (ref <15)

## 2016-09-13 ENCOUNTER — Ambulatory Visit: Payer: Medicare Other | Admitting: Rehabilitation

## 2016-09-17 ENCOUNTER — Ambulatory Visit: Payer: Medicare Other | Attending: Family Medicine | Admitting: Physical Therapy

## 2016-09-17 DIAGNOSIS — R2689 Other abnormalities of gait and mobility: Secondary | ICD-10-CM | POA: Insufficient documentation

## 2016-09-17 NOTE — Therapy (Signed)
[facility] [address] [place], [place], [address] Phone: [phone]   Fax:  [phone]  Patient Details  Name: Christina Ayala MRN: [id number] Date of Birth: 1951/01/21 Referring Provider:  Rodolph Bong, MD  Encounter Date: 09/17/2016   Pt arrived for PT evaluation today; however, upon subjective portion of evaluation being completed, patient reports she does not want to complete the PT eval, because she hates rehab, she doesn't think she needs to be here as her balance is not bad enough.  (This is after patient reporting that she had several near falls at a horse show in October and reporting that she doesn't feel comfortable walking without a cane).  PT explained benefits of PT evaluation and therapy process, but pt declines to stay for PT eval.  Tonyia Marschall W. 09/17/2016, 12:08 PM  Gean Maidens., PT  [facility] [address] [place], [place], [address] Phone: [phone]   Fax:  [phone]

## 2016-09-23 ENCOUNTER — Encounter: Payer: Self-pay | Admitting: Family Medicine

## 2016-09-30 DIAGNOSIS — M0609 Rheumatoid arthritis without rheumatoid factor, multiple sites: Secondary | ICD-10-CM | POA: Diagnosis not present

## 2016-10-17 DIAGNOSIS — M1990 Unspecified osteoarthritis, unspecified site: Secondary | ICD-10-CM | POA: Diagnosis not present

## 2016-10-17 DIAGNOSIS — Z886 Allergy status to analgesic agent status: Secondary | ICD-10-CM | POA: Diagnosis not present

## 2016-10-17 DIAGNOSIS — R509 Fever, unspecified: Secondary | ICD-10-CM | POA: Diagnosis not present

## 2016-10-17 DIAGNOSIS — R5383 Other fatigue: Secondary | ICD-10-CM | POA: Diagnosis not present

## 2016-10-17 DIAGNOSIS — Z880 Allergy status to penicillin: Secondary | ICD-10-CM | POA: Diagnosis not present

## 2016-10-17 DIAGNOSIS — E079 Disorder of thyroid, unspecified: Secondary | ICD-10-CM | POA: Diagnosis not present

## 2016-10-17 DIAGNOSIS — E669 Obesity, unspecified: Secondary | ICD-10-CM | POA: Diagnosis not present

## 2016-10-17 DIAGNOSIS — Z79899 Other long term (current) drug therapy: Secondary | ICD-10-CM | POA: Diagnosis not present

## 2016-10-17 DIAGNOSIS — R05 Cough: Secondary | ICD-10-CM | POA: Diagnosis not present

## 2016-10-17 DIAGNOSIS — Z883 Allergy status to other anti-infective agents status: Secondary | ICD-10-CM | POA: Diagnosis not present

## 2016-10-17 DIAGNOSIS — B349 Viral infection, unspecified: Secondary | ICD-10-CM | POA: Diagnosis not present

## 2016-10-17 DIAGNOSIS — Z6839 Body mass index (BMI) 39.0-39.9, adult: Secondary | ICD-10-CM | POA: Diagnosis not present

## 2016-10-17 DIAGNOSIS — Z882 Allergy status to sulfonamides status: Secondary | ICD-10-CM | POA: Diagnosis not present

## 2016-10-24 ENCOUNTER — Ambulatory Visit (INDEPENDENT_AMBULATORY_CARE_PROVIDER_SITE_OTHER): Payer: Medicare Other

## 2016-10-24 ENCOUNTER — Encounter: Payer: Self-pay | Admitting: Family Medicine

## 2016-10-24 ENCOUNTER — Ambulatory Visit (INDEPENDENT_AMBULATORY_CARE_PROVIDER_SITE_OTHER): Payer: Medicare Other | Admitting: Family Medicine

## 2016-10-24 VITALS — BP 150/91 | HR 94 | Temp 97.7°F | Wt 228.0 lb

## 2016-10-24 DIAGNOSIS — R0602 Shortness of breath: Secondary | ICD-10-CM | POA: Diagnosis not present

## 2016-10-24 DIAGNOSIS — I700 Atherosclerosis of aorta: Secondary | ICD-10-CM

## 2016-10-24 DIAGNOSIS — R05 Cough: Secondary | ICD-10-CM | POA: Diagnosis not present

## 2016-10-24 MED ORDER — AZITHROMYCIN 250 MG PO TABS: 250.0000 mg | ORAL_TABLET | Freq: Every day | ORAL | 0 refills | Status: DC

## 2016-10-24 MED ORDER — METHYLPREDNISOLONE 16 MG PO TABS: 16.0000 mg | ORAL_TABLET | Freq: Every day | ORAL | 0 refills | Status: DC

## 2016-10-24 MED ORDER — ALBUTEROL SULFATE (2.5 MG/3ML) 0.083% IN NEBU
2.5000 mg | INHALATION_SOLUTION | Freq: Once | RESPIRATORY_TRACT | Status: AC
Start: 2016-10-24 — End: 2016-10-24
  Administered 2016-10-24: 2.5 mg via RESPIRATORY_TRACT

## 2016-10-24 NOTE — Patient Instructions (Signed)
Thank you for coming in today. Take the higher dose of medrol.  USe azithromycin.  Get a chest xray.  Return if not better/  Call or go to the emergency room if you get worse, have trouble breathing, have chest pains, or palpitations.    Acute Bronchitis, Adult Acute bronchitis is sudden (acute) swelling of the air tubes (bronchi) in the lungs. Acute bronchitis causes these tubes to fill with mucus, which can make it hard to breathe. It can also cause coughing or wheezing. In adults, acute bronchitis usually goes away within 2 weeks. A cough caused by bronchitis may last up to 3 weeks. Smoking, allergies, and asthma can make the condition worse. Repeated episodes of bronchitis may cause further lung problems, such as chronic obstructive pulmonary disease (COPD). What are the causes? This condition can be caused by germs and by substances that irritate the lungs, including:  Cold and flu viruses. This condition is most often caused by the same virus that causes a cold.  Bacteria.  Exposure to tobacco smoke, dust, fumes, and air pollution. What increases the risk? This condition is more likely to develop in people who:  Have close contact with someone with acute bronchitis.  Are exposed to lung irritants, such as tobacco smoke, dust, fumes, and vapors.  Have a weak immune system.  Have a respiratory condition such as asthma. What are the signs or symptoms? Symptoms of this condition include:  A cough.  Coughing up clear, yellow, or green mucus.  Wheezing.  Chest congestion.  Shortness of breath.  A fever.  Body aches.  Chills.  A sore throat. How is this diagnosed? This condition is usually diagnosed with a physical exam. During the exam, your health care provider may order tests, such as chest X-rays, to rule out other conditions. He or she may also:  Test a sample of your mucus for bacterial infection.  Check the level of oxygen in your blood. This is done to  check for pneumonia.  Do a chest X-ray or lung function testing to rule out pneumonia and other conditions.  Perform blood tests. Your health care provider will also ask about your symptoms and medical history. How is this treated? Most cases of acute bronchitis clear up over time without treatment. Your health care provider may recommend:  Drinking more fluids. Drinking more makes your mucus thinner, which may make it easier to breathe.  Taking a medicine for a fever or cough.  Taking an antibiotic medicine.  Using an inhaler to help improve shortness of breath and to control a cough.  Using a cool mist vaporizer or humidifier to make it easier to breathe. Follow these instructions at home: Medicines  Take over-the-counter and prescription medicines only as told by your health care provider.  If you were prescribed an antibiotic, take it as told by your health care provider. Do not stop taking the antibiotic even if you start to feel better. General instructions  Get plenty of rest.  Drink enough fluids to keep your urine clear or pale yellow.  Avoid smoking and secondhand smoke. Exposure to cigarette smoke or irritating chemicals will make bronchitis worse. If you smoke and you need help quitting, ask your health care provider. Quitting smoking will help your lungs heal faster.  Use an inhaler, cool mist vaporizer, or humidifier as told by your health care provider.  Keep all follow-up visits as told by your health care provider. This is important. How is this prevented? To lower your  risk of getting this condition again:  Wash your hands often with soap and water. If soap and water are not available, use hand sanitizer.  Avoid contact with people who have cold symptoms.  Try not to touch your hands to your mouth, nose, or eyes.  Make sure to get the flu shot every year. Contact a health care provider if:  Your symptoms do not improve in 2 weeks of treatment. Get  help right away if:  You cough up blood.  You have chest pain.  You have severe shortness of breath.  You become dehydrated.  You faint or keep feeling like you are going to faint.  You keep vomiting.  You have a severe headache.  Your fever or chills gets worse. This information is not intended to replace advice given to you by your health care provider. Make sure you discuss any questions you have with your health care provider. Document Released: 12/05/2004 Document Revised: 05/22/2016 Document Reviewed: 04/17/2016 Elsevier Interactive Patient Education  2017 [employer].

## 2016-10-24 NOTE — Progress Notes (Signed)
Christina Ayala is a 65 y.o. female who presents to [facility] [place] today for shortness of breath cough and hoarse voice. Patient has a long history of cough congestion and hoarseness and shortness of breath. She was seen in the emergency department on December 7 where x-ray was unremarkable and lab work was unremarkable. She was given Ladona Ridgel which have not helped. She notes continued shortness of breath associated with some wheezing. She denies chest pains palpitations or leg swelling or recent immobility. She has a history of rheumatoid arthritis that she takes 2 mg of Medrol daily along with Plaquenil and Certolizumab.    Past Medical History:  Diagnosis Date  . Thyroid disease    No past surgical history on file. Social History  Substance Use Topics  . Smoking status: Never Smoker  . Smokeless tobacco: Not on file  . Alcohol use 0.0 oz/week   family history includes Depression in her brother; Diabetes in her maternal uncle and paternal aunt.  ROS as above:  Medications: Current Outpatient Prescriptions  Medication Sig Dispense Refill  . benzonatate (TESSALON) 100 MG capsule Take by mouth.    Marland Kitchen azithromycin (ZITHROMAX) 250 MG tablet Take 1 tablet (250 mg total) by mouth daily. Take first 2 tablets together, then 1 every day until finished. 6 tablet 0  . Certolizumab Pegol (CIMZIA PREFILLED) 2 X 200 MG/ML KIT Inject 400 mg into the skin.    . DULoxetine (CYMBALTA) 30 MG capsule Take 60 mg by mouth.    . fluticasone (FLONASE) 50 MCG/ACT nasal spray Place 2 sprays into both nostrils daily. 16 g 2  . folic acid (FOLVITE) 1 MG tablet Take 1 mg by mouth.    Marland Kitchen HYDROcodone-acetaminophen (NORCO/VICODIN) 5-325 MG tablet Take 0.5 tablets by mouth.    . hydroxychloroquine (PLAQUENIL) 200 MG tablet Take 200 mg by mouth.    . levothyroxine (SYNTHROID, LEVOTHROID) 175 MCG  tablet Take 1 tablet (175 mcg total) by mouth daily before breakfast. 90 tablet 2  . methylPREDNISolone (MEDROL) 16 MG tablet Take 1 tablet (16 mg total) by mouth daily. 5 tablet 0  . methylPREDNISolone (MEDROL) 2 MG tablet Take 0.5 tablets (1 mg total) by mouth daily. 45 tablet 0  . Multiple Vitamins-Minerals (MULTIVITAMIN WITH MINERALS) tablet Take 1 tablet by mouth.    . traMADol (ULTRAM) 50 MG tablet Take 25 mg by mouth.    . traZODone (DESYREL) 100 MG tablet Take 100 mg by mouth.    . vitamin B-12 (CYANOCOBALAMIN) 500 MCG tablet Take 500 mcg by mouth.     No current facility-administered medications for this visit.    Allergies  Allergen Reactions  . Other Anaphylaxis    portabella and cremini mushrooms.   . Eggs Or Egg-Derived Products   . Penicillins Nausea And Vomiting    Can take keflex  . Percocet [Oxycodone-Acetaminophen] Other (See Comments)    Mood changes, vision changes.   Marland Kitchen Spinach   . Sulfa Antibiotics Nausea And Vomiting  . Erythromycin Base Nausea Only    Health Maintenance Health Maintenance  Topic Date Due  . MAMMOGRAM  11/11/2048 (Originally 04/21/2001)  . INFLUENZA VACCINE  09/12/2049 (Originally 06/11/2016)  . ZOSTAVAX  09/12/2049 (Originally 04/22/2011)  . PNA vac Low Risk Adult (2 of 2 - PPSV23) 09/03/2017  . COLONOSCOPY  09/12/2018  . TETANUS/TDAP  09/12/2025  . DEXA SCAN  Completed  . Hepatitis C Screening  Completed  . HIV Screening  Addressed     Exam:  BP (!) 150/91   Pulse 94   Temp 97.7 F (36.5 C) (Oral)   Wt 228 lb (103.4 kg)  Gen: Well NAD Nontoxic appearing HEENT: EOMI,  MMM Lungs: Mild increased work of breathing. Coarse breath sounds especially at the bases with prolonged expiratory phase and some wheezing. Heart: RRR no MRG Abd: NABS, Soft. Nondistended, Nontender Exts: Brisk capillary refill, warm and well perfused. No leg swelling or palpable cords present  Patient was given 2.5 mg of albuterol nebulizer treatment and did not  feel any better   No results found for this or any previous visit (from the past 72 hour(s)). No results found.    Assessment and Plan: 65 y.o. female with respiratory illness unclear etiology. Patient's symptoms seem consistent with bronchitis. X-ray on December 7 was unremarkable however patient seems a bit worse. She is immunocompromised and therefore think repeat x-ray is reasonable today. Additionally we'll treat empirically with Medrol as well as azithromycin. Prompt follow-up if not better. I don't think is much point using albuterol as she did not improve with the nebulizer treatment today.   Orders Placed This Encounter  Procedures  . DG Chest 2 View    Order Specific Question:   Reason for exam:    Answer:   Cough, assess intra-thoracic pathology    Order Specific Question:   Preferred imaging location?    Answer:   [facility]    Discussed warning signs or symptoms. Please see discharge instructions. Patient expresses understanding.

## 2016-11-06 DIAGNOSIS — M0609 Rheumatoid arthritis without rheumatoid factor, multiple sites: Secondary | ICD-10-CM | POA: Diagnosis not present

## 2016-11-06 DIAGNOSIS — Z6841 Body Mass Index (BMI) 40.0 and over, adult: Secondary | ICD-10-CM | POA: Diagnosis not present

## 2016-11-06 DIAGNOSIS — M8580 Other specified disorders of bone density and structure, unspecified site: Secondary | ICD-10-CM | POA: Diagnosis not present

## 2016-11-06 DIAGNOSIS — M797 Fibromyalgia: Secondary | ICD-10-CM | POA: Diagnosis not present

## 2016-11-06 DIAGNOSIS — M150 Primary generalized (osteo)arthritis: Secondary | ICD-10-CM | POA: Diagnosis not present

## 2016-11-07 DIAGNOSIS — M0609 Rheumatoid arthritis without rheumatoid factor, multiple sites: Secondary | ICD-10-CM | POA: Diagnosis not present

## 2016-12-04 DIAGNOSIS — M546 Pain in thoracic spine: Secondary | ICD-10-CM | POA: Diagnosis not present

## 2016-12-04 DIAGNOSIS — M9902 Segmental and somatic dysfunction of thoracic region: Secondary | ICD-10-CM | POA: Diagnosis not present

## 2016-12-04 DIAGNOSIS — M9901 Segmental and somatic dysfunction of cervical region: Secondary | ICD-10-CM | POA: Diagnosis not present

## 2016-12-04 DIAGNOSIS — M542 Cervicalgia: Secondary | ICD-10-CM | POA: Diagnosis not present

## 2016-12-05 DIAGNOSIS — M0609 Rheumatoid arthritis without rheumatoid factor, multiple sites: Secondary | ICD-10-CM | POA: Diagnosis not present

## 2017-01-02 DIAGNOSIS — M0609 Rheumatoid arthritis without rheumatoid factor, multiple sites: Secondary | ICD-10-CM | POA: Diagnosis not present

## 2017-01-30 DIAGNOSIS — M0609 Rheumatoid arthritis without rheumatoid factor, multiple sites: Secondary | ICD-10-CM | POA: Diagnosis not present

## 2017-02-01 ENCOUNTER — Emergency Department (HOSPITAL_BASED_OUTPATIENT_CLINIC_OR_DEPARTMENT_OTHER): Payer: Medicare Other

## 2017-02-01 ENCOUNTER — Encounter (HOSPITAL_BASED_OUTPATIENT_CLINIC_OR_DEPARTMENT_OTHER): Payer: Self-pay | Admitting: Emergency Medicine

## 2017-02-01 ENCOUNTER — Emergency Department (HOSPITAL_BASED_OUTPATIENT_CLINIC_OR_DEPARTMENT_OTHER)
Admission: EM | Admit: 2017-02-01 | Discharge: 2017-02-01 | Disposition: A | Discharge status: Home / Self Care | Payer: Medicare Other | Attending: Emergency Medicine | Admitting: Emergency Medicine

## 2017-02-01 DIAGNOSIS — E039 Hypothyroidism, unspecified: Secondary | ICD-10-CM | POA: Insufficient documentation

## 2017-02-01 DIAGNOSIS — M545 Low back pain, unspecified: Secondary | ICD-10-CM

## 2017-02-01 DIAGNOSIS — Z79899 Other long term (current) drug therapy: Secondary | ICD-10-CM | POA: Insufficient documentation

## 2017-02-01 DIAGNOSIS — M5489 Other dorsalgia: Secondary | ICD-10-CM | POA: Diagnosis not present

## 2017-02-01 DIAGNOSIS — M549 Dorsalgia, unspecified: Secondary | ICD-10-CM | POA: Diagnosis not present

## 2017-02-01 HISTORY — DX: Rheumatoid arthritis, unspecified: M06.9

## 2017-02-01 HISTORY — DX: Fibromyalgia: M79.7

## 2017-02-01 MED ORDER — PROMETHAZINE HCL 25 MG/ML IJ SOLN
25.0000 mg | Freq: Once | INTRAMUSCULAR | Status: AC
  Administered 2017-02-01: 25 mg via INTRAMUSCULAR
  Filled 2017-02-01: qty 1

## 2017-02-01 MED ORDER — ONDANSETRON 4 MG PO TBDP
4.0000 mg | ORAL_TABLET | Freq: Once | ORAL | Status: AC
Start: 2017-02-01 — End: 2017-02-01
  Administered 2017-02-01: 4 mg via ORAL
  Filled 2017-02-01: qty 1

## 2017-02-01 MED ORDER — HYDROMORPHONE HCL 1 MG/ML IJ SOLN
2.0000 mg | Freq: Once | INTRAMUSCULAR | Status: AC
  Administered 2017-02-01: 2 mg via INTRAMUSCULAR
  Filled 2017-02-01: qty 2

## 2017-02-01 MED ORDER — HYDROMORPHONE HCL 1 MG/ML IJ SOLN
INTRAMUSCULAR | Status: AC
  Filled 2017-02-01: qty 2

## 2017-02-01 MED ORDER — ONDANSETRON 4 MG PO TBDP
4.0000 mg | ORAL_TABLET | Freq: Once | ORAL | Status: DC
  Filled 2017-02-01: qty 1

## 2017-02-01 NOTE — ED Triage Notes (Signed)
Pt reports h/o back pain and last night while getting up from commode she "pulled it" and has been in severe pain since.  Lumber pain bilateral.

## 2017-02-01 NOTE — Discharge Instructions (Signed)
Continue your tramadol as previously prescribed.  Follow-up with your primary Dr. if you're not improving in the next week.

## 2017-02-01 NOTE — ED Provider Notes (Signed)
[facility] Provider Note   CSN: [id number] Arrival date & time: 02/01/17  1929  By signing my name below, I, Dora Sims, attest that this documentation has been prepared under the direction and in the presence of physician practitioner, Veryl Speak, MD. Electronically Signed: Dora Sims, Scribe. 02/01/2017. 8:04 PM.  History   Chief Complaint Chief Complaint  Patient presents with  . Back Pain    The history is provided by the patient. No language interpreter was used.     HPI Comments: Christina Ayala is a 66 y.o. female with PMHx including fibromyalgia and thoracic aortic atherosclerosis who presents to the Emergency Department complaining of sudden onset, constant, severe, lower back pain beginning around 330 AM this morning. She states she stood up from sitting down on the toilet and her back "slipped out" which caused excruciating pain to her lower back. No falls. She states her pain is worse with movement and ambulating and better with laying flat and sitting still. Pt has Tramadol 63m at home and used it every few hours with some improvement of her current back pain. She has also applied IcyHot to her lower back with no significant relief. She has a h/o lower back pain since experiencing "slipped discs" 30 years ago but notes her current back pain is much more severe than usual. She denies lower extremity weakness, numbness/tingling, urinary/bowel incontinence, difficulty urinating, abdominal pain, or any other associated symptoms.  Past Medical History:  Diagnosis Date  . Fibromyalgia   . Rheumatoid arthritis ([place]   . Thyroid disease     Patient Active Problem List   Diagnosis Date Noted  . [facility] ([facility] 10/24/2016  . Poor balance 09/03/2016  . 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 Vitamin D deficiency 09/03/2016  . Left knee pain 04/02/2016  . Osteopenia 09/15/2015  . Obese 09/13/2015  . Post-menopausal 09/13/2015  .  Rheumatoid arthritis ([place] 09/13/2015  . Fibromyalgia 09/13/2015  . History of hysterectomy 09/13/2015  . Vitamin B12 deficiency 09/13/2015  . 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 Hypothyroidism 09/13/2015    Past Surgical History:  Procedure Laterality Date  . ABDOMINAL HYSTERECTOMY    . CHOLECYSTECTOMY    . REPLACEMENT TOTAL KNEE Right   . TONSILLECTOMY    . TYMPANOSTOMY TUBE PLACEMENT      OB History    No data available       Home Medications    Prior to Admission medications   Medication Sig Start Date End Date Taking? Authorizing Provider  Certolizumab Pegol (CIMZIA PREFILLED) 2 X 200 MG/ML KIT Inject 400 mg into the skin.   Yes Historical Provider, MD  DULoxetine (CYMBALTA) 30 MG capsule Take 60 mg by mouth.   Yes Historical Provider, MD  folic acid (FOLVITE) 1 MG tablet Take 1 mg by mouth.   Yes Historical Provider, MD  HYDROcodone-acetaminophen (NORCO/VICODIN) 5-325 MG tablet Take 0.5 tablets by mouth.   Yes Historical Provider, MD  hydroxychloroquine (PLAQUENIL) 200 MG tablet Take 200 mg by mouth.   Yes Historical Provider, MD  leflunomide (ARAVA) 20 MG tablet Take 20 mg by mouth daily.   Yes Historical Provider, MD  levothyroxine (SYNTHROID, LEVOTHROID) 175 MCG tablet Take 1 tablet (175 mcg total) by mouth daily before breakfast. 05/06/16  Yes EGregor Hams MD  methylPREDNISolone (MEDROL) 16 MG tablet Take 1 tablet (16 mg total) by mouth daily. 10/24/16  Yes EGregor Hams MD  methylPREDNISolone (MEDROL) 2 MG tablet Take 0.5 tablets (  1 mg total) by mouth daily. 02/26/16  Yes Gregor Hams, MD  Multiple Vitamins-Minerals (MULTIVITAMIN WITH MINERALS) tablet Take 1 tablet by mouth.   Yes Historical Provider, MD  traMADol (ULTRAM) 50 MG tablet Take 25 mg by mouth.   Yes Historical Provider, MD  traZODone (DESYREL) 100 MG tablet Take 100 mg by mouth.   Yes Historical Provider, MD  vitamin B-12 (CYANOCOBALAMIN) 500 MCG tablet Take 500 mcg by mouth.    Yes Historical Provider, MD  azithromycin (ZITHROMAX) 250 MG tablet Take 1 tablet (250 mg total) by mouth daily. Take first 2 tablets together, then 1 every day until finished. 10/24/16   Gregor Hams, MD  benzonatate (TESSALON) 100 MG capsule Take by mouth. 10/17/16   Historical Provider, MD  fluticasone (FLONASE) 50 MCG/ACT nasal spray Place 2 sprays into both nostrils daily. 10/26/15   Gregor Hams, MD    Family History Family History  Problem Relation Age of Onset  . Depression Brother   . Diabetes Maternal Uncle   . Diabetes Paternal Aunt     Social History Social History  Substance Use Topics  . Smoking status: Never Smoker  . Smokeless tobacco: Never Used  . Alcohol use 0.0 oz/week     Allergies   Other; Eggs or egg-derived products; Penicillins; Percocet [oxycodone-acetaminophen]; Spinach; Sulfa antibiotics; and Erythromycin base   Review of Systems Review of Systems  Gastrointestinal: Negative for abdominal pain.       Negative for bowel incontinence.  Genitourinary: Negative for difficulty urinating.       Negative for urinary incontinence.  Musculoskeletal: Positive for back pain.  Neurological: Negative for weakness and numbness.  All other systems reviewed and are negative.  Physical Exam Updated Vital Signs BP 100/65 (BP Location: Right Arm)   Pulse 75   Temp 99 F (37.2 C) (Oral)   Resp 16   Ht _0  (1.6 m)   Wt 221 lb (100.2 kg)   SpO2 99%   BMI 39.15 kg/m   Physical Exam  Constitutional: She is oriented to person, place, and time. She appears well-developed and well-nourished.  HENT:  Head: Normocephalic.  Eyes: EOM are normal.  Neck: Normal range of motion.  Pulmonary/Chest: Effort normal.  Abdominal: She exhibits no distension.  Musculoskeletal: Normal range of motion. She exhibits tenderness.  TTP in the lower lumbar region. No bony tenderness or step-offs.  Neurological: She is alert and oriented to person, place, and time.  DTR's are  trace and equal in both lower extremities. Strength is 5/5 in both lower extremities.  Psychiatric: She has a normal mood and affect.  Nursing note and vitals reviewed.  ED Treatments / Results  Labs (all labs ordered are listed, but only abnormal results are displayed) Labs Reviewed - No data to display  EKG  EKG Interpretation None       Radiology No results found.  Procedures Procedures (including critical care time)  DIAGNOSTIC STUDIES: Oxygen Saturation is 99% on RA, normal by my interpretation.    COORDINATION OF CARE: 8:12 PM Discussed treatment plan with pt at bedside and pt agreed to plan.  Medications Ordered in ED Medications - No data to display   Initial Impression / Assessment and Plan / ED Course  I have reviewed the triage vital signs and the nursing notes.  Pertinent labs & imaging results that were available during my care of the patient were reviewed by me and considered in my medical decision making (see chart for  details).  Patient presents here with complaints of low back pain that began earlier this morning when getting up off the toilet. She denies any fall or other trauma. There is no radiation into her legs and no bowel or bladder complaints. She describes her pain as severe and worse when she ambulates or moves.  On exam she is neurologically intact and there are no bowel or bladder complaints. I see nothing that appears emergent. I did obtain a CT scan to rule out compression fracture. This was negative. She is feeling better after medications given in the ER and will be discharged to home. She has tramadol at home she tells me she can take. Return as needed for any problems.  Final Clinical Impressions(s) / ED Diagnoses   Final diagnoses:  None    New Prescriptions New Prescriptions   No medications on file   I personally performed the services described in this documentation, which was scribed in my presence. The recorded information  has been reviewed and is accurate.       Veryl Speak, MD 02/01/17 2145

## 2017-02-03 ENCOUNTER — Ambulatory Visit (INDEPENDENT_AMBULATORY_CARE_PROVIDER_SITE_OTHER): Payer: Medicare Other | Admitting: Family Medicine

## 2017-02-03 VITALS — BP 122/96 | HR 72

## 2017-02-03 DIAGNOSIS — S39012A Strain of muscle, fascia and tendon of lower back, initial encounter: Secondary | ICD-10-CM | POA: Diagnosis not present

## 2017-02-03 NOTE — Progress Notes (Signed)
Christina Ayala is a 66 y.o. female who presents to [facility] today for back pain area and patient developed acute onset of low back pain several days ago. This occurred when she was standing up to get off the toilet. She denies any radiating pain weakness or numbness. She was seen in the emergency department where she was thought to have vertebral compression fracture. Fortunately she had a negative CT scan of her lumbar spine. She's been using 50 mg of tramadol every 6 hours to control her pain which works well. She denies fevers chills vomiting or diarrhea.   Past Medical History:  Diagnosis Date  . Fibromyalgia   . Rheumatoid arthritis ([place])   . Thyroid disease    Past Surgical History:  Procedure Laterality Date  . ABDOMINAL HYSTERECTOMY    . CHOLECYSTECTOMY    . REPLACEMENT TOTAL KNEE Right   . TONSILLECTOMY    . TYMPANOSTOMY TUBE PLACEMENT     Social History  Substance Use Topics  . Smoking status: Never Smoker  . Smokeless tobacco: Never Used  . Alcohol use 0.0 oz/week     ROS:  As above   Medications: Current Outpatient Prescriptions  Medication Sig Dispense Refill  . Certolizumab Pegol (CIMZIA PREFILLED) 2 X 200 MG/ML KIT Inject 400 mg into the skin.    . DULoxetine (CYMBALTA) 30 MG capsule Take 60 mg by mouth.    . fluticasone (FLONASE) 50 MCG/ACT nasal spray Place 2 sprays into both nostrils daily. 16 g 2  . folic acid (FOLVITE) 1 MG tablet Take 1 mg by mouth.    Marland Kitchen HYDROcodone-acetaminophen (NORCO/VICODIN) 5-325 MG tablet Take 0.5 tablets by mouth.    . hydroxychloroquine (PLAQUENIL) 200 MG tablet Take 200 mg by mouth.    . leflunomide (ARAVA) 20 MG tablet Take 20 mg by mouth daily.    Marland Kitchen levothyroxine (SYNTHROID, LEVOTHROID) 175 MCG tablet Take 1 tablet (175 mcg total) by mouth daily before breakfast. 90 tablet 2  . methylPREDNISolone (MEDROL) 16 MG tablet Take 1 tablet (16 mg total) by mouth daily. 5 tablet 0  .  methylPREDNISolone (MEDROL) 2 MG tablet Take 0.5 tablets (1 mg total) by mouth daily. 45 tablet 0  . Multiple Vitamins-Minerals (MULTIVITAMIN WITH MINERALS) tablet Take 1 tablet by mouth.    . traMADol (ULTRAM) 50 MG tablet Take 25 mg by mouth.    . traZODone (DESYREL) 100 MG tablet Take 100 mg by mouth.    . vitamin B-12 (CYANOCOBALAMIN) 500 MCG tablet Take 500 mcg by mouth.     No current facility-administered medications for this visit.    Allergies  Allergen Reactions  . Other Anaphylaxis    portabella and cremini mushrooms.   . Eggs Or Egg-Derived Products   . Penicillins Nausea And Vomiting    Can take keflex  . Percocet [Oxycodone-Acetaminophen] Other (See Comments)    Mood changes, vision changes.   Marland Kitchen Spinach   . Sulfa Antibiotics Nausea And Vomiting  . Erythromycin Base Nausea Only     Exam:  BP (!) 122/96   Pulse 72  General: Well Developed, well nourished, and in no acute distress.  Neuro/Psych: Alert and oriented x3, extra-ocular muscles intact, able to move all 4 extremities, sensation grossly intact. Skin: Warm and dry, no rashes noted.  Respiratory: Not using accessory muscles, speaking in full sentences, trachea midline.  Cardiovascular: Pulses palpable, no extremity edema. Abdomen: Does not appear distended. MSK: L-spine: Diffusely tender lumbar sacral region including  the midline. Lower shin he strength and reflexes are equal and normal throughout. Sensation is intact throughout.    No results found for this or any previous visit (from the past 48 hour(s)). Ct Lumbar Spine Wo Contrast  Result Date: 02/01/2017 CLINICAL DATA:  66 year old female with severe back pain. EXAM: CT LUMBAR SPINE WITHOUT CONTRAST TECHNIQUE: Multidetector CT imaging of the lumbar spine was performed without intravenous contrast administration. Multiplanar CT image reconstructions were also generated. COMPARISON:  None. FINDINGS: Segmentation: There are 6 non rib bearing vertebral  bodies. For the purpose of this report the lowermost vertebra is designated L5. Alignment: There is no acute subluxation. There is normal lumbar lordosis. Vertebrae: There is no acute fracture. The vertebral body heights are maintained. The bones are osteopenic. There is lesion within the L3 vertebral body with lucent matrix and coarsened trabecula most likely representing hemangioma. Other etiologies are less likely but not excluded MRI may provide better characterisation. Fifty-five visualized transverse processes as well as the spinous processes are intact. Paraspinal and other soft tissues: Negative. Disc levels: There is advanced disc disease at L5-S1 with endplate irregularity and disc desiccation. IMPRESSION: 1. No acute fracture or dislocation. 2. Osteopenia.  Degenerative changes and disease and L5-S1. 3. Mixed density lesion within the L3 most consistent with a hemangioma. MRI may provide better evaluation. Electronically Signed   By: Anner Crete M.D.   On: 02/01/2017 21:04      Assessment and Plan: 66 y.o. female with lumbosacral strain. No fracture. Continue tramadol for pain control. Plan to treat with physical therapy he had in home exercise program. Recheck in 2-3 weeks if not better.    Orders Placed This Encounter  Procedures  . Ambulatory referral to Physical Therapy    Referral Priority:   Routine    Referral Type:   Physical Medicine    Referral Reason:   Specialty Services Required    Requested Specialty:   Physical Therapy    Number of Visits Requested:   1    Discussed warning signs or symptoms. Please see discharge instructions. Patient expresses understanding.

## 2017-02-03 NOTE — Patient Instructions (Signed)
Thank you for coming in today. Return in 3-4 weeks if not better.  Attend PT next weeks if not better.  Come back or go to the emergency room if you notice new weakness new numbness problems walking or bowel or bladder problems. Use tramadol as needed.    Lumbosacral Strain Lumbosacral strain is an injury that causes pain in the lower back (lumbosacral spine). This injury usually occurs from overstretching the muscles or ligaments along your spine. A strain can affect one or more muscles or cord-like tissues that connect bones to other bones (ligaments). What are the causes? This condition may be caused by:  A hard, direct hit (blow) to the back.  Excessive stretching of the lower back muscles. This may result from:  A fall.  Lifting something heavy.  Repetitive movements such as bending or crouching. What increases the risk? The following factors may increase your risk of getting this condition:  Participating in sports or activities that involve:  A sudden twist of the back.  Pushing or pulling motions.  Being overweight or obese.  Having poor strength and flexibility, especially tight hamstrings or weak muscles in the back or abdomen.  Having too much of a curve in the lower back.  Having a pelvis that is tilted forward. What are the signs or symptoms? The main symptom of this condition is pain in the lower back, at the site of the strain. Pain may extend (radiate) down one or both legs. How is this diagnosed? This condition is diagnosed based on:  Your symptoms.  Your medical history.  A physical exam.  Your health care provider may push on certain areas of your back to determine the source of your pain.  You may be asked to bend forward, backward, and side to side to assess the severity of your pain and your range of motion.  Imaging tests, such as:  X-rays.  MRI. How is this treated? Treatment for this condition may include:  Putting heat and cold on  the affected area.  Medicines to help relieve pain and relax your muscles (muscle relaxants).  NSAIDs to help reduce swelling and discomfort. When your symptoms improve, it is important to gradually return to your normal routine as soon as possible to reduce pain, avoid stiffness, and avoid loss of muscle strength. Generally, symptoms should improve within 6 weeks of treatment. However, recovery time varies. Follow these instructions at home: Managing pain, stiffness, and swelling    If directed, put ice on the injured area during the first 24 hours after your strain.  Put ice in a plastic bag.  Place a towel between your skin and the bag.  Leave the ice on for 20 minutes, 2-3 times a day.  If directed, put heat on the affected area as often as told by your health care provider. Use the heat source that your health care provider recommends, such as a moist heat pack or a heating pad.  Place a towel between your skin and the heat source.  Leave the heat on for 20-30 minutes.  Remove the heat if your skin turns bright red. This is especially important if you are unable to feel pain, heat, or cold. You may have a greater risk of getting burned. Activity   Rest and return to your normal activities as told by your health care provider. Ask your health care provider what activities are safe for you.  Avoid activities that take a lot of energy for as long as told  by your health care provider. General instructions   Take over-the-counter and prescription medicines only as told by your health care provider.  Donot drive or use heavy machinery while taking prescription pain medicine.  Do not use any products that contain nicotine or tobacco, such as cigarettes and e-cigarettes. If you need help quitting, ask your health care provider.  Keep all follow-up visits as told by your health care provider. This is important. How is this prevented?  Use correct form when playing sports and  lifting heavy objects.  Use good posture when sitting and standing.  Maintain a healthy weight.  Sleep on a mattress with medium firmness to support your back.  Be safe and responsible while being active to avoid falls.  Do at least 150 minutes of moderate-intensity exercise each week, such as brisk walking or water aerobics. Try a form of exercise that takes stress off your back, such as swimming or stationary cycling.  Maintain physical fitness, including:  Strength.  Flexibility.  Cardiovascular fitness.  Endurance. Contact a health care provider if:  Your back pain does not improve after 6 weeks of treatment.  Your symptoms get worse. Get help right away if:  Your back pain is severe.  You cannot stand or walk.  You have difficulty controlling when you urinate or when you have a bowel movement.  You feel nauseous or you vomit.  Your feet get very cold.  You have numbness, tingling, weakness, or problems using your arms or legs.  You develop any of the following:  Shortness of breath.  Dizziness.  Pain in your legs.  Weakness in your buttocks or legs.  Discoloration of the skin on your toes or legs. This information is not intended to replace advice given to you by your health care provider. Make sure you discuss any questions you have with your health care provider. Document Released: 08/07/2005 Document Revised: 05/17/2016 Document Reviewed: 03/31/2016 Elsevier Interactive Patient Education  2017 [employer].

## 2017-02-21 DIAGNOSIS — M0609 Rheumatoid arthritis without rheumatoid factor, multiple sites: Secondary | ICD-10-CM | POA: Diagnosis not present

## 2017-02-21 DIAGNOSIS — Z6841 Body Mass Index (BMI) 40.0 and over, adult: Secondary | ICD-10-CM | POA: Diagnosis not present

## 2017-02-21 DIAGNOSIS — M8580 Other specified disorders of bone density and structure, unspecified site: Secondary | ICD-10-CM | POA: Diagnosis not present

## 2017-02-21 DIAGNOSIS — M150 Primary generalized (osteo)arthritis: Secondary | ICD-10-CM | POA: Diagnosis not present

## 2017-02-21 DIAGNOSIS — M797 Fibromyalgia: Secondary | ICD-10-CM | POA: Diagnosis not present

## 2017-02-28 ENCOUNTER — Encounter: Payer: Self-pay | Admitting: Family Medicine

## 2017-02-28 ENCOUNTER — Ambulatory Visit (INDEPENDENT_AMBULATORY_CARE_PROVIDER_SITE_OTHER): Payer: Medicare Other | Admitting: Family Medicine

## 2017-02-28 ENCOUNTER — Ambulatory Visit (INDEPENDENT_AMBULATORY_CARE_PROVIDER_SITE_OTHER): Payer: Medicare Other

## 2017-02-28 VITALS — BP 143/94 | HR 82 | Temp 98.6°F | Wt 228.0 lb

## 2017-02-28 DIAGNOSIS — E039 Hypothyroidism, unspecified: Secondary | ICD-10-CM | POA: Diagnosis not present

## 2017-02-28 DIAGNOSIS — R05 Cough: Secondary | ICD-10-CM | POA: Insufficient documentation

## 2017-02-28 DIAGNOSIS — R059 Cough, unspecified: Secondary | ICD-10-CM | POA: Insufficient documentation

## 2017-02-28 MED ORDER — OMEPRAZOLE 40 MG PO CPDR: 40.0000 mg | DELAYED_RELEASE_CAPSULE | Freq: Every day | ORAL | 3 refills | Status: DC

## 2017-02-28 MED ORDER — ALBUTEROL SULFATE HFA 108 (90 BASE) MCG/ACT IN AERS: 2.0000 | INHALATION_SPRAY | Freq: Four times a day (QID) | RESPIRATORY_TRACT | 0 refills | Status: DC | PRN

## 2017-02-28 NOTE — Patient Instructions (Addendum)
Thank you for coming in today. Get labs and xray today.  Try an albuterol inhailer for cough up to every 4 hours.   If not getting better in 1-2 weeks let me know and I will refer to pulmonalogy.   Take omeprazole for 1 month.   Call or go to the emergency room if you get worse, have trouble breathing, have chest pains, or palpitations.    Cough, Adult Coughing is a reflex that clears your throat and your airways. Coughing helps to heal and protect your lungs. It is normal to cough occasionally, but a cough that happens with other symptoms or lasts a long time may be a sign of a condition that needs treatment. A cough may last only 2-3 weeks (acute), or it may last longer than 8 weeks (chronic). What are the causes? Coughing is commonly caused by:  Breathing in substances that irritate your lungs.  A viral or bacterial respiratory infection.  Allergies.  Asthma.  Postnasal drip.  Smoking.  Acid backing up from the stomach into the esophagus (gastroesophageal reflux).  Certain medicines.  Chronic lung problems, including COPD (or rarely, lung cancer).  Other medical conditions such as heart failure. Follow these instructions at home: Pay attention to any changes in your symptoms. Take these actions to help with your discomfort:  Take medicines only as told by your health care provider.  If you were prescribed an antibiotic medicine, take it as told by your health care provider. Do not stop taking the antibiotic even if you start to feel better.  Talk with your health care provider before you take a cough suppressant medicine.  Drink enough fluid to keep your urine clear or pale yellow.  If the air is dry, use a cold steam vaporizer or humidifier in your bedroom or your home to help loosen secretions.  Avoid anything that causes you to cough at work or at home.  If your cough is worse at night, try sleeping in a semi-upright position.  Avoid cigarette smoke. If you  smoke, quit smoking. If you need help quitting, ask your health care provider.  Avoid caffeine.  Avoid alcohol.  Rest as needed. Contact a health care provider if:  You have new symptoms.  You cough up pus.  Your cough does not get better after 2-3 weeks, or your cough gets worse.  You cannot control your cough with suppressant medicines and you are losing sleep.  You develop pain that is getting worse or pain that is not controlled with pain medicines.  You have a fever.  You have unexplained weight loss.  You have night sweats. Get help right away if:  You cough up blood.  You have difficulty breathing.  Your heartbeat is very fast. This information is not intended to replace advice given to you by your health care provider. Make sure you discuss any questions you have with your health care provider. Document Released: 04/26/2011 Document Revised: 04/04/2016 Document Reviewed: 01/04/2015 Elsevier Interactive Patient Education  2017 [employer].

## 2017-02-28 NOTE — Progress Notes (Signed)
Christina Ayala is a 66 y.o. female who presents to [facility] [place] today for cough and follow up hypothyroid.  Rasheen has a 4 month history of waxing and waining cough. This has become chronic and is usually not productive. She occasionally has some SOB with the cough. She denies any fever or chills. She was seen in December for similar issue and had normal CXR. Her PMH is significant for RA on MTX therapy.   She also notes that she should be due to thyroid check. She denies any feeling too cold or too hot.     Past Medical History:  Diagnosis Date  . Fibromyalgia   . Rheumatoid arthritis ([place])   . Thyroid disease    Past Surgical History:  Procedure Laterality Date  . ABDOMINAL HYSTERECTOMY    . CHOLECYSTECTOMY    . REPLACEMENT TOTAL KNEE Right   . TONSILLECTOMY    . TYMPANOSTOMY TUBE PLACEMENT     Social History  Substance Use Topics  . Smoking status: Never Smoker  . Smokeless tobacco: Never Used  . Alcohol use 0.0 oz/week   family history includes Depression in her brother; Diabetes in her maternal uncle and paternal aunt.  ROS as above:  Medications: Current Outpatient Prescriptions  Medication Sig Dispense Refill  . methotrexate 50 MG/2ML injection 0.7 cc SQ ONCE a week    . albuterol (PROVENTIL HFA;VENTOLIN HFA) 108 (90 Base) MCG/ACT inhaler Inhale 2 puffs into the lungs every 6 (six) hours as needed for wheezing or shortness of breath. 1 Inhaler 0  . Certolizumab Pegol (CIMZIA PREFILLED) 2 X 200 MG/ML KIT Inject 400 mg into the skin.    . DULoxetine (CYMBALTA) 30 MG capsule Take 60 mg by mouth.    . fluticasone (FLONASE) 50 MCG/ACT nasal spray Place 2 sprays into both nostrils daily. 16 g 2  . folic acid (FOLVITE) 1 MG tablet Take 1 mg by mouth.    Marland Kitchen HYDROcodone-acetaminophen (NORCO/VICODIN) 5-325 MG tablet Take 0.5 tablets by mouth.    .  hydroxychloroquine (PLAQUENIL) 200 MG tablet Take 200 mg by mouth.    . leflunomide (ARAVA) 20 MG tablet Take 20 mg by mouth daily.    Marland Kitchen levothyroxine (SYNTHROID, LEVOTHROID) 175 MCG tablet Take 1 tablet (175 mcg total) by mouth daily before breakfast. 90 tablet 2  . methylPREDNISolone (MEDROL) 16 MG tablet Take 1 tablet (16 mg total) by mouth daily. 5 tablet 0  . methylPREDNISolone (MEDROL) 2 MG tablet Take 0.5 tablets (1 mg total) by mouth daily. 45 tablet 0  . Multiple Vitamins-Minerals (MULTIVITAMIN WITH MINERALS) tablet Take 1 tablet by mouth.    Marland Kitchen omeprazole (PRILOSEC) 40 MG capsule Take 1 capsule (40 mg total) by mouth daily. 30 capsule 3  . traMADol (ULTRAM) 50 MG tablet Take 25 mg by mouth.    . traZODone (DESYREL) 100 MG tablet Take 100 mg by mouth.    . vitamin B-12 (CYANOCOBALAMIN) 500 MCG tablet Take 500 mcg by mouth.     No current facility-administered medications for this visit.    Allergies  Allergen Reactions  . Other Anaphylaxis    portabella and cremini mushrooms.   . Eggs Or Egg-Derived Products   . Penicillins Nausea And Vomiting    Can take keflex  . Percocet [Oxycodone-Acetaminophen] Other (See Comments)    Mood changes, vision changes.   Marland Kitchen Spinach   . Sulfa Antibiotics Nausea And Vomiting  . Erythromycin Base Nausea  Only    Health Maintenance Health Maintenance  Topic Date Due  . MAMMOGRAM  11/11/2048 (Originally 04/21/2001)  . INFLUENZA VACCINE  09/12/2049 (Originally 06/11/2017)  . PNA vac Low Risk Adult (2 of 2 - PPSV23) 09/03/2017  . COLONOSCOPY  09/12/2018  . TETANUS/TDAP  09/12/2025  . DEXA SCAN  Completed  . Hepatitis C Screening  Completed  . HIV Screening  Completed     Exam:  BP (!) 143/94   Pulse 82   Temp 98.6 F (37 C) (Oral)   Wt 228 lb (103.4 kg)   SpO2 97%   BMI 40.39 kg/m   Gen: Well NAD HEENT: EOMI,  MMM clear nasal discharge.  Lungs: Normal work of breathing. CTABL Heart: RRR no MRG Abd: NABS, Soft. Nondistended,  Nontender Exts: Brisk capillary refill, warm and well perfused.   CXR: No obvious acute abnormality. Awaiting formal review.    No results found for this or any previous visit (from the past 72 hour(s)). No results found.    Assessment and Plan: 66 y.o. female with  Chronic cough.  Unclear etiology. CXR pending.  Plan for trial of albuterol and omeprazole. If still not better plan to refer to pulmonology.   Hypothyroid: TSH pending along with basic lab workup.    Orders Placed This Encounter  Procedures  . DG Chest 2 View    Order Specific Question:   Reason for exam:    Answer:   Cough, assess intra-thoracic pathology    Order Specific Question:   Preferred imaging location?    Answer:   [facility]  . CBC  . COMPLETE METABOLIC PANEL WITH GFR  . TSH  . T3, free  . T4, free   Meds ordered this encounter  Medications  . methotrexate 50 MG/2ML injection    Sig: 0.7 cc SQ ONCE a week  . omeprazole (PRILOSEC) 40 MG capsule    Sig: Take 1 capsule (40 mg total) by mouth daily.    Dispense:  30 capsule    Refill:  3  . albuterol (PROVENTIL HFA;VENTOLIN HFA) 108 (90 Base) MCG/ACT inhaler    Sig: Inhale 2 puffs into the lungs every 6 (six) hours as needed for wheezing or shortness of breath.    Dispense:  1 Inhaler    Refill:  0     Discussed warning signs or symptoms. Please see discharge instructions. Patient expresses understanding.

## 2017-03-01 LAB — CBC
HCT: 39.3 % (ref 35.0–45.0)
Hemoglobin: 12.9 g/dL (ref 11.7–15.5)
MCH: 32.3 pg (ref 27.0–33.0)
MCHC: 32.8 g/dL (ref 32.0–36.0)
MCV: 98.3 fL (ref 80.0–100.0)
MPV: 12.4 fL (ref 7.5–12.5)
PLATELETS: 182 10*3/uL (ref 140–400)
RBC: 4 MIL/uL (ref 3.80–5.10)
RDW: 13.5 % (ref 11.0–15.0)
WBC: 5.9 10*3/uL (ref 3.8–10.8)

## 2017-03-01 LAB — COMPLETE METABOLIC PANEL WITH GFR
ALK PHOS: 72 U/L (ref 33–130)
ALT: 17 U/L (ref 6–29)
AST: 23 U/L (ref 10–35)
Albumin: 3.6 g/dL (ref 3.6–5.1)
BILIRUBIN TOTAL: 0.4 mg/dL (ref 0.2–1.2)
BUN: 18 mg/dL (ref 7–25)
CO2: 22 mmol/L (ref 20–31)
Calcium: 8.8 mg/dL (ref 8.6–10.4)
Chloride: 108 mmol/L (ref 98–110)
Creat: 0.69 mg/dL (ref 0.50–0.99)
Glucose, Bld: 98 mg/dL (ref 65–99)
POTASSIUM: 4.2 mmol/L (ref 3.5–5.3)
SODIUM: 141 mmol/L (ref 135–146)
TOTAL PROTEIN: 6 g/dL — AB (ref 6.1–8.1)

## 2017-03-01 LAB — T4, FREE: FREE T4: 1.2 ng/dL (ref 0.8–1.8)

## 2017-03-01 LAB — T3, FREE: T3, Free: 2.7 pg/mL (ref 2.3–4.2)

## 2017-03-01 LAB — TSH: TSH: 0.81 mIU/L

## 2017-03-03 DIAGNOSIS — M0609 Rheumatoid arthritis without rheumatoid factor, multiple sites: Secondary | ICD-10-CM | POA: Diagnosis not present

## 2017-03-04 ENCOUNTER — Other Ambulatory Visit: Payer: Self-pay | Admitting: Family Medicine

## 2017-03-26 DIAGNOSIS — M797 Fibromyalgia: Secondary | ICD-10-CM | POA: Diagnosis not present

## 2017-03-26 DIAGNOSIS — M150 Primary generalized (osteo)arthritis: Secondary | ICD-10-CM | POA: Diagnosis not present

## 2017-03-26 DIAGNOSIS — M8580 Other specified disorders of bone density and structure, unspecified site: Secondary | ICD-10-CM | POA: Diagnosis not present

## 2017-03-26 DIAGNOSIS — M0609 Rheumatoid arthritis without rheumatoid factor, multiple sites: Secondary | ICD-10-CM | POA: Diagnosis not present

## 2017-03-26 DIAGNOSIS — Z6841 Body Mass Index (BMI) 40.0 and over, adult: Secondary | ICD-10-CM | POA: Diagnosis not present

## 2017-04-03 DIAGNOSIS — M0609 Rheumatoid arthritis without rheumatoid factor, multiple sites: Secondary | ICD-10-CM | POA: Diagnosis not present

## 2017-05-02 DIAGNOSIS — M0609 Rheumatoid arthritis without rheumatoid factor, multiple sites: Secondary | ICD-10-CM | POA: Diagnosis not present

## 2017-05-27 DIAGNOSIS — M8580 Other specified disorders of bone density and structure, unspecified site: Secondary | ICD-10-CM | POA: Diagnosis not present

## 2017-05-27 DIAGNOSIS — M0609 Rheumatoid arthritis without rheumatoid factor, multiple sites: Secondary | ICD-10-CM | POA: Diagnosis not present

## 2017-05-27 DIAGNOSIS — M797 Fibromyalgia: Secondary | ICD-10-CM | POA: Diagnosis not present

## 2017-05-27 DIAGNOSIS — M150 Primary generalized (osteo)arthritis: Secondary | ICD-10-CM | POA: Diagnosis not present

## 2017-05-27 DIAGNOSIS — Z6841 Body Mass Index (BMI) 40.0 and over, adult: Secondary | ICD-10-CM | POA: Diagnosis not present

## 2017-05-30 DIAGNOSIS — M0609 Rheumatoid arthritis without rheumatoid factor, multiple sites: Secondary | ICD-10-CM | POA: Diagnosis not present

## 2017-06-04 DIAGNOSIS — Z79899 Other long term (current) drug therapy: Secondary | ICD-10-CM | POA: Diagnosis not present

## 2017-06-04 DIAGNOSIS — H2513 Age-related nuclear cataract, bilateral: Secondary | ICD-10-CM | POA: Diagnosis not present

## 2017-06-04 DIAGNOSIS — H527 Unspecified disorder of refraction: Secondary | ICD-10-CM | POA: Diagnosis not present

## 2017-06-27 DIAGNOSIS — M0609 Rheumatoid arthritis without rheumatoid factor, multiple sites: Secondary | ICD-10-CM | POA: Diagnosis not present

## 2017-07-25 DIAGNOSIS — M0609 Rheumatoid arthritis without rheumatoid factor, multiple sites: Secondary | ICD-10-CM | POA: Diagnosis not present

## 2017-08-06 DIAGNOSIS — M546 Pain in thoracic spine: Secondary | ICD-10-CM | POA: Diagnosis not present

## 2017-08-06 DIAGNOSIS — M9901 Segmental and somatic dysfunction of cervical region: Secondary | ICD-10-CM | POA: Diagnosis not present

## 2017-08-06 DIAGNOSIS — M542 Cervicalgia: Secondary | ICD-10-CM | POA: Diagnosis not present

## 2017-08-06 DIAGNOSIS — M9902 Segmental and somatic dysfunction of thoracic region: Secondary | ICD-10-CM | POA: Diagnosis not present

## 2017-08-10 ENCOUNTER — Encounter: Payer: Self-pay | Admitting: Emergency Medicine

## 2017-08-10 ENCOUNTER — Emergency Department (INDEPENDENT_AMBULATORY_CARE_PROVIDER_SITE_OTHER)
Admission: EM | Admit: 2017-08-10 | Discharge: 2017-08-10 | Disposition: A | Discharge status: Home / Self Care | Payer: Medicare Other | Source: Home / Self Care | Attending: Emergency Medicine | Admitting: Emergency Medicine

## 2017-08-10 DIAGNOSIS — M5440 Lumbago with sciatica, unspecified side: Secondary | ICD-10-CM | POA: Diagnosis not present

## 2017-08-10 MED ORDER — METHYLPREDNISOLONE ACETATE 80 MG/ML IJ SUSP
80.0000 mg | Freq: Once | INTRAMUSCULAR | Status: AC
  Administered 2017-08-10: 80 mg via INTRAMUSCULAR

## 2017-08-10 MED ORDER — CYCLOBENZAPRINE HCL 5 MG PO TABS
ORAL_TABLET | ORAL | 0 refills | Status: DC
Start: 2017-08-10 — End: 2023-01-12

## 2017-08-10 NOTE — ED Triage Notes (Signed)
Patient presents to [facility] with a complaint of Right Sided Back Pain x 1 day

## 2017-08-17 NOTE — ED Provider Notes (Signed)
[facility]    CSN: [id number] Arrival date & time: 08/10/17  1204     History   Chief Complaint Chief Complaint  Patient presents with  . Back Pain    HPI Christina Ayala is a 66 y.o. female.   HPI Chief complaint: Lumbar pain. Of note, she has a complicated chronic musculoskeletal history, including rheumatoid arthritis and fibromyalgia followed and treated by her rheumatologist. She states she has a history of episodic flareup of low back pain over the years and she feels she recently had a flareup. She felt mid and right lumbar pain and soreness over the past week, went to a chiropractor, and that may have helped a little with some manipulation. Then, yesterday, while getting up out of bed, she "felt my back slip" and developed moderate to severe mid lumbar and right lumbar pain that was dull and sharp, worse with standing, 7 out of 10 intensity, which radiated to right thigh, but not beyond the right thigh. She states that in the past,she's had similar flareups, and "a cortisone shot and prescription for Flexeril muscle relaxant helped in the past", and she is requesting that treatment today. She states she avoids NSAIDs as they've caused GI upset in the past. Associated symptoms:Denies any new focal weakness or numbness. No incontinence. No abdominal pain, nausea, vomiting, diarrhea, fever, chills, dysuria. Past surgical history noted below, note she denies back surgery in the past. Past Medical History:  Diagnosis Date  . Fibromyalgia   . Rheumatoid arthritis ([place])   . Rheumatoid arthritis ([place])   . Thyroid disease     Patient Active Problem List   Diagnosis Date Noted  . Cough 02/28/2017  . Thoracic aortic atherosclerosis ([place]) 10/24/2016  . Poor balance 09/03/2016  . 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 Vitamin D deficiency 09/03/2016  . Left knee pain 04/02/2016  . Osteopenia 09/15/2015  . Obese 09/13/2015  . Post-menopausal 09/13/2015  . Rheumatoid  arthritis ([place]) 09/13/2015  . Fibromyalgia 09/13/2015  . History of hysterectomy 09/13/2015  . Vitamin B12 deficiency 09/13/2015  . 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 Hypothyroidism 09/13/2015    Past Surgical History:  Procedure Laterality Date  . ABDOMINAL HYSTERECTOMY    . CHOLECYSTECTOMY    . REPLACEMENT TOTAL KNEE Right   . TONSILLECTOMY    . TYMPANOSTOMY TUBE PLACEMENT      OB History    No data available       Home Medications    Prior to Admission medications   Medication Sig Start Date End Date Taking? Authorizing Provider  albuterol (PROVENTIL HFA;VENTOLIN HFA) 108 (90 Base) MCG/ACT inhaler Inhale 2 puffs into the lungs every 6 (six) hours as needed for wheezing or shortness of breath. 02/28/17   Gregor Hams, MD  Certolizumab Pegol (CIMZIA PREFILLED) 2 X 200 MG/ML KIT Inject 400 mg into the skin.    [provider]  cyclobenzaprine (FLEXERIL) 5 MG tablet Take 1 or 2 every 8 hours as needed for muscle relaxant.  caution: May cause drowsiness 08/10/17   Jacqulyn Cane, MD  DULoxetine (CYMBALTA) 30 MG capsule Take 60 mg by mouth.    [provider]  fluticasone (FLONASE) 50 MCG/ACT nasal spray Place 2 sprays into both nostrils daily. 10/26/15   Gregor Hams, MD  folic acid (FOLVITE) 1 MG tablet Take 1 mg by mouth.    [provider]  HYDROcodone-acetaminophen (NORCO/VICODIN) 5-325 MG tablet Take 0.5 tablets by mouth.  [provider]  hydroxychloroquine (PLAQUENIL) 200 MG tablet Take 200 mg by mouth.    [provider]  leflunomide (ARAVA) 20 MG tablet Take 20 mg by mouth daily.    [provider]  levothyroxine (SYNTHROID, LEVOTHROID) 175 MCG tablet TAKE ONE TABLET BY MOUTH ONCE DAILY BEFORE BREAKFAST 03/04/17   Gregor Hams, MD  methotrexate 50 MG/2ML injection 0.8 cc SQ ONCE a week 08/07/15   [provider]  methylPREDNISolone (MEDROL) 16 MG tablet Take 1 tablet (16 mg total) by  mouth daily. 10/24/16   Gregor Hams, MD  methylPREDNISolone (MEDROL) 2 MG tablet Take 0.5 tablets (1 mg total) by mouth daily. 02/26/16   Gregor Hams, MD  Multiple Vitamins-Minerals (MULTIVITAMIN WITH MINERALS) tablet Take 1 tablet by mouth.    [provider]  omeprazole (PRILOSEC) 40 MG capsule Take 1 capsule (40 mg total) by mouth daily. 02/28/17   Gregor Hams, MD  traMADol (ULTRAM) 50 MG tablet Take 25 mg by mouth.    [provider]  traZODone (DESYREL) 100 MG tablet Take 100 mg by mouth.    [provider]  vitamin B-12 (CYANOCOBALAMIN) 500 MCG tablet Take 500 mcg by mouth.    [provider]    Family History Family History  Problem Relation Age of Onset  . Depression Brother   . Diabetes Maternal Uncle   . Diabetes Paternal Aunt     Social History Social History  Substance Use Topics  . Smoking status: Never Smoker  . Smokeless tobacco: Never Used  . Alcohol use 0.0 oz/week     Allergies   Other; Eggs or egg-derived products; Penicillins; Percocet [oxycodone-acetaminophen]; Spinach; Sulfa antibiotics; and Erythromycin base   Review of Systems Review of Systems  All other systems reviewed and are negative.    Physical Exam Triage Vital Signs ED Triage Vitals  Enc Vitals Group     BP 08/10/17 1223 137/82     Pulse Rate 08/10/17 1223 75     Resp 08/10/17 1223 16     Temp 08/10/17 1223 98.5 F (36.9 C)     Temp Source 08/10/17 1223 Oral     SpO2 08/10/17 1223 97 %     Weight 08/10/17 1226 223 lb (101.2 kg)     Height 08/10/17 1226 _0  (1.6 m)     Head Circumference --      Peak Flow --      Pain Score 08/10/17 1226 7     Pain Loc --      Pain Edu? --      Excl. in [place]? --    No data found.   Updated Vital Signs BP 137/82 (BP Location: Right Arm)   Pulse 75   Temp 98.5 F (36.9 C) (Oral)   Resp 16   Ht _1  (1.6 m)   Wt 223 lb (101.2 kg)   SpO2 97%   BMI 39.50 kg/m   Visual Acuity Right Eye Distance:    Left Eye Distance:   Bilateral Distance:    Right Eye Near:   Left Eye Near:    Bilateral Near:     Physical Exam  Constitutional: She is oriented to person, place, and time. She appears well-developed and well-nourished. She is cooperative.  Non-toxic appearance. She appears distressed (Appears mildly to moderately uncomfortable from low back pain.).  HENT:  Head: Normocephalic and atraumatic.  Mouth/Throat: Oropharynx is clear and moist.  Eyes: Pupils are equal, round, and  reactive to light. EOM are normal. No scleral icterus.  Neck: Neck supple.  Cardiovascular: Regular rhythm and normal heart sounds.   Pulmonary/Chest: Effort normal and breath sounds normal. No respiratory distress. She has no wheezes. She has no rales. She exhibits no tenderness.  Abdominal: Soft. There is no tenderness.  Musculoskeletal:       Right hip: Normal.       Left hip: Normal.       Cervical back: She exhibits no tenderness.       Thoracic back: She exhibits no tenderness.       Lumbar back: She exhibits decreased range of motion, tenderness and spasm. She exhibits no swelling, no edema, no deformity, no laceration and normal pulse.  Negative Right straight leg-raise test. Negative Left straight leg-raise test.  Negative Right Saralyn Pilar test. Negative Left Saralyn Pilar test.    Neurological: She is alert and oriented to person, place, and time. She has normal strength. She displays no atrophy, no tremor and normal reflexes. No cranial nerve deficit or sensory deficit. She exhibits normal muscle tone. Gait normal.  Reflex Scores:      Patellar reflexes are 2+ on the right side and 2+ on the left side.      Achilles reflexes are 2+ on the right side and 2+ on the left side. Skin: Skin is warm, dry and intact. No lesion and no rash noted.  Psychiatric: She has a normal mood and affect.  Nursing note and vitals reviewed.    UC Treatments / Results  Labs (all labs ordered are listed, but only abnormal  results are displayed) Labs Reviewed - No data to display  EKG  EKG Interpretation None       Radiology No results found.  Procedures Procedures (including critical care time)  Medications Ordered in UC Medications  methylPREDNISolone acetate (DEPO-MEDROL) injection 80 mg (80 mg Intramuscular Given 08/10/17 1324)     Initial Impression / Assessment and Plan / UC Course  I have reviewed the triage vital signs and the nursing notes.  Pertinent labs & imaging results that were available during my care of the patient were reviewed by me and considered in my medical decision making (see chart for details).     Clinically, she likely has low back strain, with some right-sided sciatica symptoms, but no focal neurologic deficit.   Final Clinical Impressions(s) / UC Diagnoses   Final diagnoses:  Acute right-sided low back pain with sciatica, sciatica laterality unspecified   Treatment options discussed, as well as risks, benefits, alternatives. She declined any imaging today. Patient voiced understanding and agreement with the following plans: Depo-Medrol 80 mg IM stat  New Prescriptions Discharge Medication List as of 08/10/2017  1:48 PM    START taking these medications   Details  cyclobenzaprine (FLEXERIL) 5 MG tablet Take 1 or 2 every 8 hours as needed for muscle relaxant.  caution: May cause drowsiness, Print        Follow-up with your rheumatologist or primary care doctor in 2-3 days if not improving, or sooner if symptoms become worse. Precautions discussed. Red flags discussed. Questions invited and answered. Patient voiced understanding and agreement.    Jacqulyn Cane, MD 08/17/17 [phone]

## 2017-08-22 DIAGNOSIS — Z79899 Other long term (current) drug therapy: Secondary | ICD-10-CM | POA: Diagnosis not present

## 2017-08-22 DIAGNOSIS — M0609 Rheumatoid arthritis without rheumatoid factor, multiple sites: Secondary | ICD-10-CM | POA: Diagnosis not present

## 2017-08-26 DIAGNOSIS — M797 Fibromyalgia: Secondary | ICD-10-CM | POA: Diagnosis not present

## 2017-08-26 DIAGNOSIS — Z6839 Body mass index (BMI) 39.0-39.9, adult: Secondary | ICD-10-CM | POA: Diagnosis not present

## 2017-08-26 DIAGNOSIS — M150 Primary generalized (osteo)arthritis: Secondary | ICD-10-CM | POA: Diagnosis not present

## 2017-08-26 DIAGNOSIS — M8580 Other specified disorders of bone density and structure, unspecified site: Secondary | ICD-10-CM | POA: Diagnosis not present

## 2017-08-26 DIAGNOSIS — M0609 Rheumatoid arthritis without rheumatoid factor, multiple sites: Secondary | ICD-10-CM | POA: Diagnosis not present

## 2017-08-26 DIAGNOSIS — E669 Obesity, unspecified: Secondary | ICD-10-CM | POA: Diagnosis not present

## 2017-09-22 DIAGNOSIS — M0609 Rheumatoid arthritis without rheumatoid factor, multiple sites: Secondary | ICD-10-CM | POA: Diagnosis not present

## 2017-10-17 ENCOUNTER — Ambulatory Visit (INDEPENDENT_AMBULATORY_CARE_PROVIDER_SITE_OTHER): Payer: Medicare Other | Admitting: Physician Assistant

## 2017-10-17 ENCOUNTER — Encounter: Payer: Self-pay | Admitting: Physician Assistant

## 2017-10-17 VITALS — BP 148/79 | HR 96 | Temp 98.4°F | Wt 229.0 lb

## 2017-10-17 DIAGNOSIS — Z1329 Encounter for screening for other suspected endocrine disorder: Secondary | ICD-10-CM | POA: Diagnosis not present

## 2017-10-17 DIAGNOSIS — I493 Ventricular premature depolarization: Secondary | ICD-10-CM

## 2017-10-17 DIAGNOSIS — R058 Other specified cough: Secondary | ICD-10-CM

## 2017-10-17 DIAGNOSIS — D848 Other specified immunodeficiencies: Secondary | ICD-10-CM

## 2017-10-17 DIAGNOSIS — R05 Cough: Secondary | ICD-10-CM

## 2017-10-17 DIAGNOSIS — T380X5A Adverse effect of glucocorticoids and synthetic analogues, initial encounter: Secondary | ICD-10-CM

## 2017-10-17 DIAGNOSIS — R9431 Abnormal electrocardiogram [ECG] [EKG]: Secondary | ICD-10-CM

## 2017-10-17 DIAGNOSIS — Z7952 Long term (current) use of systemic steroids: Secondary | ICD-10-CM

## 2017-10-17 DIAGNOSIS — J0110 Acute frontal sinusitis, unspecified: Secondary | ICD-10-CM

## 2017-10-17 MED ORDER — CEFUROXIME AXETIL 250 MG PO TABS: 250.0000 mg | ORAL_TABLET | Freq: Two times a day (BID) | ORAL | 0 refills | Status: AC

## 2017-10-17 MED ORDER — AZITHROMYCIN 250 MG PO TABS: ORAL_TABLET | ORAL | 0 refills | Status: DC

## 2017-10-17 MED ORDER — BENZONATATE 100 MG PO CAPS: 100.0000 mg | ORAL_CAPSULE | Freq: Three times a day (TID) | ORAL | 0 refills | Status: AC | PRN

## 2017-10-17 NOTE — Progress Notes (Signed)
HPI:                                                                Christina Ayala is a 66 y.o. female who presents to [facility]: [facility] today for cough and congestion  Cough  The current episode started 1 to 4 weeks ago (x 10 days). The problem has been gradually worsening. The problem occurs every few minutes. The cough is productive of purulent sputum. Associated symptoms include ear congestion, headaches and nasal congestion. Pertinent negatives include no ear pain. Nothing aggravates the symptoms. Treatments tried: Dayquil, Nyquil. The treatment provided mild relief. There is no history of asthma or COPD. RF on biologic     Past Medical History:  Diagnosis Date  . Fibromyalgia   . Rheumatoid arthritis ([place])   . Rheumatoid arthritis ([place])   . Thyroid disease    Past Surgical History:  Procedure Laterality Date  . ABDOMINAL HYSTERECTOMY    . CHOLECYSTECTOMY    . REPLACEMENT TOTAL KNEE Right   . TONSILLECTOMY    . TYMPANOSTOMY TUBE PLACEMENT     Social History   Tobacco Use  . Smoking status: Never Smoker  . Smokeless tobacco: Never Used  Substance Use Topics  . Alcohol use: Yes    Alcohol/week: 0.0 oz   family history includes Depression in her brother; Diabetes in her maternal uncle and paternal aunt.  ROS: negative except as noted in the HPI  Medications: Current Outpatient Medications  Medication Sig Dispense Refill  . albuterol (PROVENTIL HFA;VENTOLIN HFA) 108 (90 Base) MCG/ACT inhaler Inhale 2 puffs into the lungs every 6 (six) hours as needed for wheezing or shortness of breath. 1 Inhaler 0  . Certolizumab Pegol (CIMZIA PREFILLED) 2 X 200 MG/ML KIT Inject 400 mg into the skin.    . DULoxetine (CYMBALTA) 30 MG capsule Take 60 mg by mouth.    . folic acid (FOLVITE) 1 MG tablet Take 1 mg by mouth.    Marland Kitchen HYDROcodone-acetaminophen (NORCO/VICODIN) 5-325 MG tablet Take 0.5 tablets by mouth.    . hydroxychloroquine (PLAQUENIL)  200 MG tablet Take 200 mg by mouth.    . leflunomide (ARAVA) 20 MG tablet Take 20 mg by mouth daily.    Marland Kitchen levothyroxine (SYNTHROID, LEVOTHROID) 175 MCG tablet TAKE ONE TABLET BY MOUTH ONCE DAILY BEFORE BREAKFAST 90 tablet 2  . methotrexate 50 MG/2ML injection 0.8 cc SQ ONCE a week    . methylPREDNISolone (MEDROL) 16 MG tablet Take 1 tablet (16 mg total) by mouth daily. 5 tablet 0  . methylPREDNISolone (MEDROL) 2 MG tablet Take 0.5 tablets (1 mg total) by mouth daily. 45 tablet 0  . Multiple Vitamins-Minerals (MULTIVITAMIN WITH MINERALS) tablet Take 1 tablet by mouth.    Marland Kitchen omeprazole (PRILOSEC) 40 MG capsule Take 1 capsule (40 mg total) by mouth daily. 30 capsule 3  . traMADol (ULTRAM) 50 MG tablet Take 25 mg by mouth.    . traZODone (DESYREL) 100 MG tablet Take 100 mg by mouth.    . vitamin B-12 (CYANOCOBALAMIN) 500 MCG tablet Take 500 mcg by mouth.    Marland Kitchen azithromycin (ZITHROMAX Z-PAK) 250 MG tablet Take 2 tablets (500 mg) on  Day 1,  followed by 1 tablet (250 mg) once daily  on Days 2 through 5. 6 tablet 0  . benzonatate (TESSALON) 100 MG capsule Take 1 capsule (100 mg total) by mouth 3 (three) times daily as needed for up to 7 days for cough. 40 capsule 0  . cyclobenzaprine (FLEXERIL) 5 MG tablet Take 1 or 2 every 8 hours as needed for muscle relaxant.  caution: May cause drowsiness (Patient not taking: Reported on 10/17/2017) 30 tablet 0  . fluticasone (FLONASE) 50 MCG/ACT nasal spray Place 2 sprays into both nostrils daily. 16 g 2   No current facility-administered medications for this visit.    Allergies  Allergen Reactions  . Other Anaphylaxis    portabella and cremini mushrooms.   . Eggs Or Egg-Derived Products   . Penicillins Nausea And Vomiting    Can take keflex  . Percocet [Oxycodone-Acetaminophen] Other (See Comments)    Mood changes, vision changes.   Marland Kitchen Spinach   . Sulfa Antibiotics Nausea And Vomiting  . Erythromycin Base Nausea Only       Objective:  BP (!) 148/79    Pulse 96   Temp 98.4 F (36.9 C) (Oral)   Wt 229 lb (103.9 kg)   SpO2 96%   BMI 40.57 kg/m  Gen:  alert, not ill-appearing, no distress, appropriate for age, morbidly obese female HEENT: head normocephalic without obvious abnormality, conjunctiva and cornea clear, TM's clear bilaterally, there is bilaterally maxillary sinus tenderness, nasal mucosa edematous, oropharynx clear, neck supple, no adenopathy, trachea midline Pulm: Normal work of breathing, normal phonation, clear to auscultation bilaterally, no wheezes, rales or rhonchi CV: Normal rate, irregular rhythm, s1 and s2 distinct, no murmurs, clicks or rubs  Neuro: alert and oriented x 3, no tremor MSK: extremities atraumatic, normal gait and station Skin: intact, no rashes on exposed skin, no jaundice, no cyanosis  No flowsheet data found.   No results found for this or any previous visit (from the past 72 hour(s)). No results found.  ECG 10/17/2017 Vent rate approx. 100 bpm, irregular rhythm PR-I 122 ms QRS 118 ms QT/QTc 390/479 ms Sinus rhythm, PVC's with compensatory pause, prolonged QTc, RBBB No prior ECG for comparison  Assessment and Plan: 66 y.o. female with  1. Acute non-recurrent frontal sinusitis - allergic to penicillins, doxycyline contraindicated in the setting of methotrexate, macrolide contraindicated in the setting of prolonged QT - cefUROXime (CEFTIN) 250 MG tablet; Take 1 tablet (250 mg total) by mouth 2 (two) times daily with a meal for 10 days.  Dispense: 20 tablet; Refill: 0  2. Cough productive of purulent sputum - patient is immunosuppressed due to biologic and methotrexate, pneumonia vaccine UTD, no adventitious lung sounds on exam, avoiding Macrolide due to slightly prolonged QTc on ECG today. Recommend close follow-up with PCP in 1 week. If symptoms persist, recommend chest x-ray and antibiotic coverage for CAP - benzonatate (TESSALON) 100 MG capsule; Take 1 capsule (100 mg total) by mouth 3  (three) times daily as needed for up to 7 days for cough.  Dispense: 40 capsule; Refill: 0   3. 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 Abnormal ECG - patient found on exam to have irregular rhythm. ECG was ordered, which showed sinus rhythm, PVC's with compensatory pause, prolonged QTc, and RBBB. Patient is asymptomatic. This was discussed with supervising physician, Aundria Mems, MD, who recommended echocardiogram  - CBC - Comprehensive metabolic panel - T4, free - TSH - ECHOCARDIOGRAM COMPLETE; Future  5. Asymptomatic PVCs - CBC - Comprehensive metabolic panel - T4, free -  TSH - ECHOCARDIOGRAM COMPLETE; Future  6. Prolonged Q-T interval on ECG - QTc >470  7. Screening for thyroid disorder - T4, free - TSH  Patient education and anticipatory guidance given Patient agrees with treatment plan Follow-up with PCP in 1 week or sooner as needed if symptoms worsen or fail to improve  I spent 40 minutes with this patient, greater than 50% was face-to-face time counseling regarding the above diagnoses  Darlyne Russian PA-C

## 2017-10-17 NOTE — Patient Instructions (Signed)
Sinus Headache A sinus headache occurs when the paranasal sinuses become clogged or swollen. Paranasal sinuses are air pockets within the bones of the face. Sinus headaches can range from mild to severe. What are the causes? A sinus headache can result from various conditions that affect the sinuses, such as:  Colds.  Sinus infections.  Allergies.  What are the signs or symptoms? The main symptom of this condition is a headache that may feel like pain or pressure in the face, forehead, ears, or upper teeth. People who have a sinus headache often have other symptoms, such as:  Congested or runny nose.  Fever.  Inability to smell.  Weather changes can make symptoms worse. How is this diagnosed? This condition may be diagnosed based on:  A physical exam and medical history.  Imaging tests, such as a CT scan and MRI, to check for problems with the sinuses.  A specialist may look into the sinuses with a tool that has a camera (endoscopy).  How is this treated? Treatment for this condition depends on the cause.  Sinus pain that is caused by a sinus infection may be treated with antibiotic medicine.  Sinus pain that is caused by allergies may be helped by allergy medicines (antihistamines) and medicated nasal sprays.  Sinus pain that is caused by congestion may be helped by flushing the nose and sinuses with saline solution.  Follow these instructions at home:  Take medicines only as directed by your health care provider.  If you were prescribed an antibiotic medicine, finish all of it even if you start to feel better.  If you have congestion, use a nasal spray to help reduce pressure.  If directed, apply a warm, moist washcloth to your face to help relieve pain. Contact a health care provider if:  You have headaches more than one time each week.  You have sensitivity to light or sound.  You have a fever.  You feel sick to your stomach (nauseous) or you throw up  (vomit).  Your headaches do not get better with treatment. Many people think that they have a sinus headache when they actually have migraines or tension headaches. Get help right away if:  You have vision problems.  You have sudden, severe pain in your face or head.  You have a seizure.  You are confused.  You have a stiff neck. This information is not intended to replace advice given to you by your health care provider. Make sure you discuss any questions you have with your health care provider. Document Released: 12/05/2004 Document Revised: 06/23/2016 Document Reviewed: 10/24/2014 Elsevier Interactive Patient Education  2018 [employer]  

## 2017-10-18 LAB — COMPREHENSIVE METABOLIC PANEL
AG RATIO: 1.4 (calc) (ref 1.0–2.5)
ALT: 41 U/L — AB (ref 6–29)
AST: 41 U/L — AB (ref 10–35)
Albumin: 3.4 g/dL — ABNORMAL LOW (ref 3.6–5.1)
Alkaline phosphatase (APISO): 74 U/L (ref 33–130)
BILIRUBIN TOTAL: 0.4 mg/dL (ref 0.2–1.2)
BUN: 14 mg/dL (ref 7–25)
CALCIUM: 8.5 mg/dL — AB (ref 8.6–10.4)
CO2: 24 mmol/L (ref 20–32)
Chloride: 107 mmol/L (ref 98–110)
Creat: 0.9 mg/dL (ref 0.50–0.99)
GLUCOSE: 96 mg/dL (ref 65–99)
Globulin: 2.4 g/dL (calc) (ref 1.9–3.7)
Potassium: 4 mmol/L (ref 3.5–5.3)
Sodium: 140 mmol/L (ref 135–146)
Total Protein: 5.8 g/dL — ABNORMAL LOW (ref 6.1–8.1)

## 2017-10-18 LAB — T4, FREE: Free T4: 1.1 ng/dL (ref 0.8–1.8)

## 2017-10-18 LAB — TSH: TSH: 0.53 m[IU]/L (ref 0.40–4.50)

## 2017-10-18 LAB — CBC
HEMATOCRIT: 36.9 % (ref 35.0–45.0)
HEMOGLOBIN: 12.4 g/dL (ref 11.7–15.5)
MCH: 32.5 pg (ref 27.0–33.0)
MCHC: 33.6 g/dL (ref 32.0–36.0)
MCV: 96.9 fL (ref 80.0–100.0)
MPV: 12.9 fL — ABNORMAL HIGH (ref 7.5–12.5)
Platelets: 173 10*3/uL (ref 140–400)
RBC: 3.81 10*6/uL (ref 3.80–5.10)
RDW: 12.7 % (ref 11.0–15.0)
WBC: 7.2 10*3/uL (ref 3.8–10.8)

## 2017-10-21 ENCOUNTER — Encounter: Payer: Self-pay | Admitting: Physician Assistant

## 2017-10-21 DIAGNOSIS — I493 Ventricular premature depolarization: Secondary | ICD-10-CM | POA: Insufficient documentation

## 2017-10-21 DIAGNOSIS — R9431 Abnormal electrocardiogram [ECG] [EKG]: Secondary | ICD-10-CM | POA: Insufficient documentation

## 2017-10-21 NOTE — Progress Notes (Signed)
No anemia or electrolyte abnormalities to explain EKG abnormalities TSH is in a normal range, but low end of normal. I would discuss slightly decreasing dose of Levothyroxine with Dr. 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 Denyse Amass this week in the office to ensure cough is improving and antibiotics are working

## 2017-10-22 DIAGNOSIS — M0609 Rheumatoid arthritis without rheumatoid factor, multiple sites: Secondary | ICD-10-CM | POA: Diagnosis not present

## 2017-10-31 NOTE — Addendum Note (Signed)
Addended by: Collie Siad on: 10/31/2017 09:42 AM   Modules accepted: Orders

## 2017-11-07 ENCOUNTER — Ambulatory Visit (HOSPITAL_BASED_OUTPATIENT_CLINIC_OR_DEPARTMENT_OTHER)
Admission: RE | Admit: 2017-11-07 | Discharge: 2017-11-07 | Disposition: A | Discharge status: Home / Self Care | Payer: Medicare Other | Source: Ambulatory Visit | Attending: Physician Assistant | Admitting: Physician Assistant

## 2017-11-07 DIAGNOSIS — I493 Ventricular premature depolarization: Secondary | ICD-10-CM | POA: Insufficient documentation

## 2017-11-07 DIAGNOSIS — R9431 Abnormal electrocardiogram [ECG] [EKG]: Secondary | ICD-10-CM | POA: Insufficient documentation

## 2017-11-07 NOTE — Progress Notes (Signed)
Good news, echocardiogram was normal. Heart is structurally normal and pumping well. I do recommend follow-up with Dr. Denyse Amass about the abnormal EKG. These are electrical abnormalities, which do not appear on an echo. She may have a heart dysrhythmia that requires more testing, such as holter monitor or cardiac event monitor.

## 2017-11-07 NOTE — Progress Notes (Signed)
Echocardiogram 2D Echocardiogram has been performed.  Dorothey Baseman 11/07/2017, 12:03 PM

## 2017-11-19 ENCOUNTER — Other Ambulatory Visit (HOSPITAL_BASED_OUTPATIENT_CLINIC_OR_DEPARTMENT_OTHER): Payer: Medicare Other

## 2017-11-20 ENCOUNTER — Ambulatory Visit (INDEPENDENT_AMBULATORY_CARE_PROVIDER_SITE_OTHER): Payer: Medicare Other | Admitting: Family Medicine

## 2017-11-20 VITALS — BP 135/82 | HR 100 | Ht 63.0 in | Wt 299.0 lb

## 2017-11-20 DIAGNOSIS — R0609 Other forms of dyspnea: Secondary | ICD-10-CM

## 2017-11-20 DIAGNOSIS — S29012A Strain of muscle and tendon of back wall of thorax, initial encounter: Secondary | ICD-10-CM | POA: Insufficient documentation

## 2017-11-20 DIAGNOSIS — R9431 Abnormal electrocardiogram [ECG] [EKG]: Secondary | ICD-10-CM | POA: Diagnosis not present

## 2017-11-20 DIAGNOSIS — R0602 Shortness of breath: Secondary | ICD-10-CM | POA: Diagnosis not present

## 2017-11-20 DIAGNOSIS — I493 Ventricular premature depolarization: Secondary | ICD-10-CM

## 2017-11-20 DIAGNOSIS — E039 Hypothyroidism, unspecified: Secondary | ICD-10-CM | POA: Diagnosis not present

## 2017-11-20 MED ORDER — LEVOTHYROXINE SODIUM 150 MCG PO TABS: 150.0000 ug | ORAL_TABLET | Freq: Every day | ORAL | 1 refills | Status: DC

## 2017-11-20 NOTE — Patient Instructions (Signed)
Thank you for coming in today. For the fatigue and shortness of breath with exertion I have referred you to cardiology.  You should hear from [facility] cardiology in less than 1 week.  Let me know if you do not hear anything.   For Rhomboid strain continue heat, stretching, TENS, and use flexeril as needed.   Also start the band exercises, Straight down 45 degree out  Rowing pinching shoulder blades.  Try massage with tennis ball.   Reduce levothyroxine dose to 150  Recheck in 6 weeks.

## 2017-11-21 NOTE — Progress Notes (Signed)
Christina Ayala is a 67 y.o. female who presents to [facility] today for follow-up EKG and echocardiogram.  Additionally Christina Ayala is here to follow-up her left rhomboid muscle pain.  EEG and echocardiogram.  Christina Ayala had a mildly abnormal EKG showing a right bundle branch block and PVCs.  Did an echocardiogram showing diastolic dysfunction.  She is essentially asymptomatic with no chest pain or palpitations.  She does note some shortness of breath especially with exertion.  She is not sure if this is related to her potential heart issues or to a lung issue.  She has a history of rheumatoid arthritis managed with Plaquenil.    Additionally Christina Ayala has a history of left rhomboid pain.  This is been ongoing for about 2 weeks.  She notes the pain is worse with arm motion but not bad with deep breathing.  Chest pain or palpitations.  She denies any radiating pain weakness or numbness.  She is tried massage and rest which has not helped much.  Hypothyroid: Christina Ayala had TSH checked recently and found to be slightly low.  Aside from the shortness of breath above no other symptoms such as feeling too hot or too cold or skin changes.   Past Medical History:  Diagnosis Date  . Fibromyalgia   . Rheumatoid arthritis (Christina Ayala)   . Rheumatoid arthritis (Christina Ayala)   . Thyroid disease    Past Surgical History:  Procedure Laterality Date  . ABDOMINAL HYSTERECTOMY    . CHOLECYSTECTOMY    . REPLACEMENT TOTAL KNEE Right   . TONSILLECTOMY    . TYMPANOSTOMY TUBE PLACEMENT     Social History   Tobacco Use  . Smoking status: Never Smoker  . Smokeless tobacco: Never Used  Substance Use Topics  . Alcohol use: Yes    Alcohol/week: 0.0 oz   family history includes Depression in her brother; Diabetes in her maternal uncle and paternal aunt.  ROS as above:  Medications: Current Outpatient Medications    Medication Sig Dispense Refill  . albuterol (PROVENTIL HFA;VENTOLIN HFA) 108 (90 Base) MCG/ACT inhaler Inhale 2 puffs into the lungs every 6 (six) hours as needed for wheezing or shortness of breath. 1 Inhaler 0  . Certolizumab Pegol (CIMZIA PREFILLED) 2 X 200 MG/ML KIT Inject 400 mg into the skin.    . cyclobenzaprine (FLEXERIL) 5 MG tablet Take 1 or 2 every 8 hours as needed for muscle relaxant.  caution: May cause drowsiness 30 tablet 0  . DULoxetine (CYMBALTA) 30 MG capsule Take 60 mg by mouth.    . folic acid (FOLVITE) 1 MG tablet Take 1 mg by mouth.    Marland Kitchen HYDROcodone-acetaminophen (NORCO/VICODIN) 5-325 MG tablet Take 0.5 tablets by mouth.    . hydroxychloroquine (PLAQUENIL) 200 MG tablet Take 200 mg by mouth.    . leflunomide (ARAVA) 20 MG tablet Take 20 mg by mouth daily.    Marland Kitchen levothyroxine (SYNTHROID, LEVOTHROID) 150 MCG tablet Take 1 tablet (150 mcg total) by mouth daily before breakfast. 90 tablet 1  . methotrexate 50 MG/2ML injection 0.8 cc SQ ONCE a week    . methylPREDNISolone (MEDROL) 16 MG tablet Take 1 tablet (16 mg total) by mouth daily. 5 tablet 0  . methylPREDNISolone (MEDROL) 2 MG tablet Take 0.5 tablets (1 mg total) by mouth daily. 45 tablet 0  . Multiple Vitamins-Minerals (MULTIVITAMIN WITH MINERALS) tablet Take 1 tablet by mouth.    . traMADol (ULTRAM) 50 MG tablet Take  25 mg by mouth.    . traZODone (DESYREL) 100 MG tablet Take 100 mg by mouth.    . vitamin B-12 (CYANOCOBALAMIN) 500 MCG tablet Take 500 mcg by mouth.     No current facility-administered medications for this visit.    Allergies  Allergen Reactions  . Other Anaphylaxis    portabella and cremini mushrooms.   . Eggs Or Egg-Derived Products   . Penicillins Nausea And Vomiting    Can take keflex  . Percocet [Oxycodone-Acetaminophen] Other (See Comments)    Mood changes, vision changes.   Marland Kitchen Spinach   . Sulfa Antibiotics Nausea And Vomiting  . Erythromycin Base Nausea Only    Health  Maintenance Health Maintenance  Topic Date Due  . PNA vac Low Risk Adult (2 of 2 - PPSV23) 09/03/2017  . MAMMOGRAM  11/11/2048 (Originally 04/21/2001)  . INFLUENZA VACCINE  09/12/2049 (Originally 06/11/2017)  . COLONOSCOPY  09/12/2018  . TETANUS/TDAP  09/12/2025  . DEXA SCAN  Completed  . Hepatitis C Screening  Completed     Exam:  BP 135/82   Pulse 100   Ht _0  (1.6 m)   Wt 299 lb (135.6 kg)   BMI 52.97 kg/m  Gen: Well NAD HEENT: EOMI,  MMM Lungs: Normal work of breathing. CTABL Heart: RRR no MRG Abd: NABS, Soft. Nondistended, Nontender Exts: Brisk capillary refill, warm and well perfused.  MSK: Tender to palpation left rhomboid.  Scapula protraction present with overhead abduction arc. Arm strength is intact     No results found for this or any previous visit (from the past 72 hour(s)). No results found.    Assessment and Plan: 67 y.o. female with  Abnormal EKG/ECHO with SOB with exertion.  This is possibly a cardiac issue.  She is asymptomatic at rest and does not have changes concerning for acute coronary syndrome.  I think is very reasonable to refer to cardiology in the anticipation that Yarnell will receive a stress test.  Reviewed precautions recheck in about a month.  Left back pain is rhomboid muscle strain.  After discussing options will refer to physical therapy and recheck in about a month.  Hypothyroidism: Christina Ayala has a history of hypothyroidism managed with levothyroxine.  Her TSH was checked by my colleague recently and found to be somewhat suppressed.  Plan to decrease levothyroxine from 175 to 150 mcg   Orders Placed This Encounter  Procedures  . Ambulatory referral to Physical Therapy    Referral Priority:   Routine    Referral Type:   Physical Medicine    Referral Reason:   Specialty Services Required    Requested Specialty:   Physical Therapy  . Ambulatory referral to Cardiology    Referral Priority:   Routine    Referral Type:   Consultation     Referral Reason:   Specialty Services Required    Requested Specialty:   Cardiology    Number of Visits Requested:   1   Meds ordered this encounter  Medications  . levothyroxine (SYNTHROID, LEVOTHROID) 150 MCG tablet    Sig: Take 1 tablet (150 mcg total) by mouth daily before breakfast.    Dispense:  90 tablet    Refill:  1     Discussed warning signs or symptoms. Please see discharge instructions. Patient expresses understanding.

## 2017-11-24 DIAGNOSIS — M0609 Rheumatoid arthritis without rheumatoid factor, multiple sites: Secondary | ICD-10-CM | POA: Diagnosis not present

## 2017-11-26 DIAGNOSIS — M150 Primary generalized (osteo)arthritis: Secondary | ICD-10-CM | POA: Diagnosis not present

## 2017-11-26 DIAGNOSIS — M8580 Other specified disorders of bone density and structure, unspecified site: Secondary | ICD-10-CM | POA: Diagnosis not present

## 2017-11-26 DIAGNOSIS — Z6841 Body Mass Index (BMI) 40.0 and over, adult: Secondary | ICD-10-CM | POA: Diagnosis not present

## 2017-11-26 DIAGNOSIS — M0609 Rheumatoid arthritis without rheumatoid factor, multiple sites: Secondary | ICD-10-CM | POA: Diagnosis not present

## 2017-11-26 DIAGNOSIS — M797 Fibromyalgia: Secondary | ICD-10-CM | POA: Diagnosis not present

## 2017-11-28 ENCOUNTER — Encounter: Payer: Self-pay | Admitting: Cardiology

## 2017-11-28 ENCOUNTER — Ambulatory Visit (HOSPITAL_BASED_OUTPATIENT_CLINIC_OR_DEPARTMENT_OTHER)
Admission: RE | Admit: 2017-11-28 | Discharge: 2017-11-28 | Disposition: A | Discharge status: Home / Self Care | Payer: Medicare Other | Source: Ambulatory Visit | Attending: Cardiology | Admitting: Cardiology

## 2017-11-28 ENCOUNTER — Other Ambulatory Visit: Payer: Self-pay

## 2017-11-28 ENCOUNTER — Ambulatory Visit (INDEPENDENT_AMBULATORY_CARE_PROVIDER_SITE_OTHER): Payer: Medicare Other | Admitting: Cardiology

## 2017-11-28 VITALS — BP 112/64 | HR 97 | Ht 63.0 in | Wt 228.0 lb

## 2017-11-28 DIAGNOSIS — I493 Ventricular premature depolarization: Secondary | ICD-10-CM | POA: Insufficient documentation

## 2017-11-28 DIAGNOSIS — I700 Atherosclerosis of aorta: Secondary | ICD-10-CM

## 2017-11-28 DIAGNOSIS — R0602 Shortness of breath: Secondary | ICD-10-CM | POA: Diagnosis not present

## 2017-11-28 DIAGNOSIS — R9431 Abnormal electrocardiogram [ECG] [EKG]: Secondary | ICD-10-CM | POA: Diagnosis not present

## 2017-11-28 DIAGNOSIS — R079 Chest pain, unspecified: Secondary | ICD-10-CM | POA: Diagnosis not present

## 2017-11-28 DIAGNOSIS — Z01818 Encounter for other preprocedural examination: Secondary | ICD-10-CM | POA: Insufficient documentation

## 2017-11-28 NOTE — Progress Notes (Signed)
Cardiology Office Note:    Date:  11/28/2017   ID:  Christina Ayala, DOB [birth year], MRN [id number]  PCP:  Gregor Hams, MD  Cardiologist:  Jenean Lindau, MD   Referring MD: Gregor Hams, MD    ASSESSMENT:    1. Asymptomatic PVCs   2. Thoracic aortic atherosclerosis ([place])   3. Abnormal ECG   4. Class 2 severe obesity due to excess calories with serious comorbidity in adult, unspecified BMI ([place])   5. SOB (shortness of breath) on exertion    PLAN:    In order of problems listed above:  1. Primary prevention stressed with the patient.  Importance of compliance with diet and medications stressed.  Her shortness of breath is concerning and I would definitely like to evaluate her coronary artery anatomy in view of her multiple risk factors.  There is also some documentation of thoracic aortic atherosclerosis.  Blood pressure stable.  In view of this I gave her the following recommendations.In view of the patient's symptoms, I discussed with the patient options for evaluation. Invasive and noninvasive options were given to the patient. I discussed stress testing and coronary angiography and left heart catheterization at length. Benefits, pros and cons of each approach were discussed at length. Patient had multiple questions which were answered to the patient's satisfaction. Patient opted for invasive evaluation and we will set up for coronary angiography and left heart catheterization. Further recommendations will be made based on the findings with coronary angiography. In the interim if the patient has any significant symptoms in hospital to the nearest emergency room. 2. Risks of obesity explained in diet was encouraged.  She is pursuing this already.  She will be seen in follow-up appointment after the coronary angiography or earlier if she has any concerns.   Medication Adjustments/Labs and Tests Ordered: Current medicines are reviewed at length with the patient today.  Concerns regarding  medicines are outlined above.  No orders of the defined types were placed in this encounter.  No orders of the defined types were placed in this encounter.    History of Present Illness:    Christina Ayala is a 67 y.o. female who is being seen today for the evaluation of shortness of breath on exertion at the request of Gregor Hams, MD.  Patient is a pleasant 67 year old female.  She has past medical history of rheumatoid arthritis and is on methotrexate.  She is morbidly obese.  She mentions to me that she has no history of hypertension or diabetes mellitus.  She mentions to me that in the past 2-3 months she has been having progressive shortness of breath on exertion and this is concerning to her.  No orthopnea or PND.  No chest tightness.  At the time of my evaluation she is alert awake oriented and in no distress.  Past Medical History:  Diagnosis Date  . Fibromyalgia   . Rheumatoid arthritis ([place])   . Rheumatoid arthritis ([place])   . Thyroid disease     Past Surgical History:  Procedure Laterality Date  . ABDOMINAL HYSTERECTOMY    . CHOLECYSTECTOMY    . REPLACEMENT TOTAL KNEE Right   . TONSILLECTOMY    . TYMPANOSTOMY TUBE PLACEMENT      Current Medications: Current Meds  Medication Sig  . albuterol (PROVENTIL HFA;VENTOLIN HFA) 108 (90 Base) MCG/ACT inhaler Inhale 2 puffs into the lungs every 6 (six) hours as needed for wheezing or shortness of breath.  . Certolizumab Pegol (CIMZIA  PREFILLED) 2 X 200 MG/ML KIT Inject 400 mg into the skin.  . cyclobenzaprine (FLEXERIL) 5 MG tablet Take 1 or 2 every 8 hours as needed for muscle relaxant.  caution: May cause drowsiness  . DULoxetine (CYMBALTA) 30 MG capsule Take 60 mg by mouth.  . folic acid (FOLVITE) 1 MG tablet Take 1 mg by mouth.  Marland Kitchen HYDROcodone-acetaminophen (NORCO/VICODIN) 5-325 MG tablet Take 0.5 tablets by mouth.  . hydroxychloroquine (PLAQUENIL) 200 MG tablet Take 200 mg by mouth.  . leflunomide (ARAVA) 20 MG tablet Take 20  mg by mouth daily.  Marland Kitchen levothyroxine (SYNTHROID, LEVOTHROID) 150 MCG tablet Take 1 tablet (150 mcg total) by mouth daily before breakfast.  . methotrexate 50 MG/2ML injection 0.8 cc SQ ONCE a week  . methylPREDNISolone (MEDROL) 2 MG tablet Take 0.5 tablets (1 mg total) by mouth daily.  . Multiple Vitamins-Minerals (MULTIVITAMIN WITH MINERALS) tablet Take 1 tablet by mouth.  . traMADol (ULTRAM) 50 MG tablet Take 25 mg by mouth.  . traZODone (DESYREL) 100 MG tablet Take 100 mg by mouth.  . vitamin B-12 (CYANOCOBALAMIN) 500 MCG tablet Take 500 mcg by mouth.     Allergies:   Other; Eggs or egg-derived products; Penicillins; Percocet [oxycodone-acetaminophen]; Spinach; Sulfa antibiotics; and Erythromycin base   Social History   Socioeconomic History  . Marital status: Married    Spouse name: None  . Number of children: None  . Years of education: None  . Highest education level: None  Social Needs  . Financial resource strain: None  . Food insecurity - worry: None  . Food insecurity - inability: None  . Transportation needs - medical: None  . Transportation needs - non-medical: None  Occupational History  . None  Tobacco Use  . Smoking status: Never Smoker  . Smokeless tobacco: Never Used  Substance and Sexual Activity  . Alcohol use: Yes    Alcohol/week: 0.0 oz  . Drug use: No  . Sexual activity: Not Currently  Other Topics Concern  . None  Social History Narrative  . None     Family History: The patient's family history includes Depression in her brother; Diabetes in her maternal uncle and paternal aunt.  ROS:   Please see the history of present illness.    All other systems reviewed and are negative.  EKGs/Labs/Other Studies Reviewed:    The following studies were reviewed today: I reviewed her records extensively from previous office visits and had questions which were answered to her satisfaction.  EKG done today reveals sinus rhythm, right bundle branch block and  PACs and PVCs.   Recent Labs: 10/17/2017: ALT 41; BUN 14; Creat 0.90; Hemoglobin 12.4; Platelets 173; Potassium 4.0; Sodium 140; TSH 0.53  Recent Lipid Panel    Component Value Date/Time   CHOL 175 09/13/2015 1010   TRIG 229 (H) 09/13/2015 1010   HDL 46 09/13/2015 1010   CHOLHDL 3.8 09/13/2015 1010   VLDL 46 (H) 09/13/2015 1010   LDLCALC 83 09/13/2015 1010    Physical Exam:    VS:  BP 112/64 (BP Location: Left Arm, Patient Position: Sitting, Cuff Size: Normal)   Pulse 97   Ht '5\' 3"'  (1.6 m)   Wt 298 lb 1.9 oz (135.2 kg)   SpO2 97%   BMI 52.81 kg/m     Wt Readings from Last 3 Encounters:  11/28/17 298 lb 1.9 oz (135.2 kg)  11/20/17 299 lb (135.6 kg)  10/17/17 229 lb (103.9 kg)     GEN: Patient  is in no acute distress HEENT: Normal NECK: No JVD; No carotid bruits LYMPHATICS: No lymphadenopathy CARDIAC: S1 S2 regular, 2/6 systolic murmur at the apex. RESPIRATORY:  Clear to auscultation without rales, wheezing or rhonchi  ABDOMEN: Soft, non-tender, non-distended MUSCULOSKELETAL:  No edema; No deformity  SKIN: Warm and dry NEUROLOGIC:  Alert and oriented x 3 PSYCHIATRIC:  Normal affect    Signed, Jenean Lindau, MD  11/28/2017 11:25 AM    [place]

## 2017-11-28 NOTE — Patient Instructions (Addendum)
Medication Instructions:  Your physician recommends that you continue on your current medications as directed. Please refer to the Current Medication list given to you today.  Labwork: Your physician recommends have return for lab work today: BMP, CBC, PT/INR  Testing/Procedures: A chest x-ray takes a picture of the organs and structures inside the chest, including the heart, lungs, and blood vessels. This test can show several things, including, whether the heart is enlarges; whether fluid is building up in the lungs; and whether pacemaker / defibrillator leads are still in place.  EKG today  Follow-Up: Your physician recommends that you schedule a follow-up appointment in: 4 weeks with Dr. Tomie China   Any Other Special Instructions Will Be Listed Below (If Applicable).     If you need a refill on your cardiac medications before your next appointment, please call your pharmacy.   Your provider has recommended a cardiac catherization  You are scheduled for a cardiac catheterization on January 22 with Dr. Herbie Baltimore or associate.  Please arrive at the [facility] (Main Entrance) at [facility] at [address], [place] -  2nd Floor Short Stay on January 22 at 10 am.    Special note: Every effort is made to have your procedure done on time.   Please understand that emergencies sometimes delay a scheduled   procedure.  No food or drink after midnight on December 03, 2017 On the morning of your procedure, take an Asprin 81 mg  You may take your morning medications with a sip of water on the day of your procedure.  Please take a baby aspirin (81 mg) on the morning of your procedure.   Plan for a one night stay -- bring personal belongings.  Bring a current list of your medications and current insurance cards.  You MUST have a responsible person to drive you home. Someone MUST be with you the first 24 hours after you arrive home or your discharge will be delayed. Wear  clothes that are easy to get on and off and wear slip on shoes.    Coronary Angiogram A coronary angiogram, also called coronary angiography, is an X-ray procedure used to look at the arteries in the heart. In this procedure, a dye (contrast dye) is injected through a long, hollow tube (catheter). The catheter is about the size of a piece of cooked spaghetti and is inserted through your groin, wrist, or arm. The dye is injected into each artery, and X-rays are then taken to show if there is a blockage in the arteries of your heart.  LET [facility] CARE PROVIDER KNOW ABOUT:  Any allergies you have, including allergies to shellfish or contrast dye.   All medicines you are taking, including vitamins, herbs, eye drops, creams, and over-the-counter medicines.   Previous problems you or members of your family have had with the use of anesthetics.   Any blood disorders you have.   Previous surgeries you have had.  History of kidney problems or failure.   Other medical conditions you have.  RISKS AND COMPLICATIONS  Generally, a coronary angiogram is a safe procedure. However, about 1 person out of 1000 can have problems that may include:  Allergic reaction to the dye.  Bleeding/bruising from the access site or other locations.  Kidney injury, especially in people with impaired kidney function.  Stroke (rare).  Heart attack (rare).  Irregular rhythms (rare)  Death (rare)  BEFORE THE PROCEDURE   Do not eat or drink anything after midnight  the night before the procedure or as directed by your health care provider.   Ask your health care provider about changing or stopping your regular medicines. This is especially important if you are taking diabetes medicines or blood thinners.  PROCEDURE  You may be given a medicine to help you relax (sedative) before the procedure. This medicine is given through an intravenous (IV) access tube that is inserted into one of your veins.    The area where the catheter will be inserted will be washed and shaved. This is usually done in the groin but may be done in the fold of your arm (near your elbow) or in the wrist.   A medicine will be given to numb the area where the catheter will be inserted (local anesthetic).   The health care provider will insert the catheter into an artery. The catheter will be guided by using a special type of X-ray (fluoroscopy) of the blood vessel being examined.   A special dye will then be injected into the catheter, and X-rays will be taken. The dye will help to show where any narrowing or blockages are located in the heart arteries.    AFTER THE PROCEDURE   If the procedure is done through the leg, you will be kept in bed lying flat for several hours. You will be instructed to not bend or cross your legs.  The insertion site will be checked frequently.   The pulse in your feet or wrist will be checked frequently.   Additional blood tests, X-rays, and an electrocardiogram may be done.

## 2017-11-29 LAB — BASIC METABOLIC PANEL
BUN / CREAT RATIO: 21 (ref 12–28)
BUN: 16 mg/dL (ref 8–27)
CALCIUM: 8.9 mg/dL (ref 8.7–10.3)
CO2: 21 mmol/L (ref 20–29)
Chloride: 107 mmol/L — ABNORMAL HIGH (ref 96–106)
Creatinine, Ser: 0.76 mg/dL (ref 0.57–1.00)
GFR calc non Af Amer: 82 mL/min/{1.73_m2} (ref >59)
GFR, EST AFRICAN AMERICAN: 95 mL/min/{1.73_m2} (ref >59)
Glucose: 115 mg/dL — ABNORMAL HIGH (ref 65–99)
POTASSIUM: 4 mmol/L (ref 3.5–5.2)
SODIUM: 143 mmol/L (ref 134–144)

## 2017-11-29 LAB — CBC
Hematocrit: 38.3 % (ref 34.0–46.6)
Hemoglobin: 12.4 g/dL (ref 11.1–15.9)
MCH: 32.2 pg (ref 26.6–33.0)
MCHC: 32.4 g/dL (ref 31.5–35.7)
MCV: 100 fL — AB (ref 79–97)
Platelets: 173 10*3/uL (ref 150–379)
RBC: 3.85 x10E6/uL (ref 3.77–5.28)
RDW: 14.1 % (ref 12.3–15.4)
WBC: 4.4 10*3/uL (ref 3.4–10.8)

## 2017-11-29 LAB — PROTIME-INR
INR: 1.1 (ref 0.8–1.2)
Prothrombin Time: 10.9 s (ref 9.1–12.0)

## 2017-12-01 ENCOUNTER — Telehealth: Payer: Self-pay

## 2017-12-01 DIAGNOSIS — I209 Angina pectoris, unspecified: Secondary | ICD-10-CM | POA: Clinically undetermined

## 2017-12-01 NOTE — Telephone Encounter (Signed)
Patient contacted pre-catheterization at [facility] scheduled for:  12/02/2017 @ 1200 Verified arrival time and place: NT @ 1000 Confirmed AM meds to be taken pre-cath with sip of water: Take ASA Confirmed patient has responsible person to drive home post procedure and observe patient for 24 hours:  yes Addl concerns:  none

## 2017-12-02 ENCOUNTER — Ambulatory Visit (HOSPITAL_COMMUNITY)
Admission: RE | Admit: 2017-12-02 | Discharge: 2017-12-02 | Disposition: A | Discharge status: Home / Self Care | Payer: Medicare Other | Source: Ambulatory Visit | Attending: Cardiology | Admitting: Cardiology

## 2017-12-02 ENCOUNTER — Encounter (HOSPITAL_COMMUNITY)
Admission: RE | Disposition: A | Discharge status: Home / Self Care | Payer: Self-pay | Source: Ambulatory Visit | Attending: Cardiology

## 2017-12-02 DIAGNOSIS — R0602 Shortness of breath: Secondary | ICD-10-CM | POA: Diagnosis not present

## 2017-12-02 DIAGNOSIS — I493 Ventricular premature depolarization: Secondary | ICD-10-CM | POA: Diagnosis not present

## 2017-12-02 DIAGNOSIS — M069 Rheumatoid arthritis, unspecified: Secondary | ICD-10-CM | POA: Diagnosis not present

## 2017-12-02 DIAGNOSIS — Z96651 Presence of right artificial knee joint: Secondary | ICD-10-CM | POA: Insufficient documentation

## 2017-12-02 DIAGNOSIS — R9431 Abnormal electrocardiogram [ECG] [EKG]: Secondary | ICD-10-CM | POA: Insufficient documentation

## 2017-12-02 DIAGNOSIS — I209 Angina pectoris, unspecified: Secondary | ICD-10-CM

## 2017-12-02 DIAGNOSIS — I10 Essential (primary) hypertension: Secondary | ICD-10-CM | POA: Diagnosis not present

## 2017-12-02 DIAGNOSIS — I700 Atherosclerosis of aorta: Secondary | ICD-10-CM | POA: Diagnosis not present

## 2017-12-02 DIAGNOSIS — R079 Chest pain, unspecified: Secondary | ICD-10-CM | POA: Diagnosis not present

## 2017-12-02 DIAGNOSIS — R0609 Other forms of dyspnea: Secondary | ICD-10-CM

## 2017-12-02 DIAGNOSIS — Z6839 Body mass index (BMI) 39.0-39.9, adult: Secondary | ICD-10-CM | POA: Insufficient documentation

## 2017-12-02 DIAGNOSIS — Z79899 Other long term (current) drug therapy: Secondary | ICD-10-CM | POA: Insufficient documentation

## 2017-12-02 DIAGNOSIS — M797 Fibromyalgia: Secondary | ICD-10-CM | POA: Insufficient documentation

## 2017-12-02 HISTORY — PX: LEFT HEART CATH AND CORONARY ANGIOGRAPHY: CATH118249

## 2017-12-02 SURGERY — LEFT HEART CATH AND CORONARY ANGIOGRAPHY
Anesthesia: LOCAL

## 2017-12-02 MED ORDER — VERAPAMIL HCL 2.5 MG/ML IV SOLN
INTRAVENOUS | Status: AC
  Filled 2017-12-02: qty 2

## 2017-12-02 MED ORDER — LIDOCAINE HCL (PF) 1 % IJ SOLN
INTRAMUSCULAR | Status: AC
  Filled 2017-12-02: qty 30

## 2017-12-02 MED ORDER — SODIUM CHLORIDE 0.9 % IV SOLN: 250.0000 mL | INTRAVENOUS | Status: DC | PRN

## 2017-12-02 MED ORDER — MIDAZOLAM HCL 2 MG/2ML IJ SOLN
INTRAMUSCULAR | Status: AC
  Filled 2017-12-02: qty 2

## 2017-12-02 MED ORDER — FENTANYL CITRATE (PF) 100 MCG/2ML IJ SOLN
INTRAMUSCULAR | Status: DC | PRN
  Administered 2017-12-02 (×2): 25 ug via INTRAVENOUS

## 2017-12-02 MED ORDER — HEPARIN (PORCINE) IN NACL 2-0.9 UNIT/ML-% IJ SOLN
INTRAMUSCULAR | Status: AC
  Filled 2017-12-02: qty 500

## 2017-12-02 MED ORDER — LIDOCAINE HCL (PF) 1 % IJ SOLN
INTRAMUSCULAR | Status: DC | PRN
  Administered 2017-12-02: 2 mL

## 2017-12-02 MED ORDER — SODIUM CHLORIDE 0.9% FLUSH: 3.0000 mL | INTRAVENOUS | Status: DC | PRN

## 2017-12-02 MED ORDER — MIDAZOLAM HCL 2 MG/2ML IJ SOLN
INTRAMUSCULAR | Status: DC | PRN
  Administered 2017-12-02: 2 mg via INTRAVENOUS

## 2017-12-02 MED ORDER — SODIUM CHLORIDE 0.9 % WEIGHT BASED INFUSION: 1.0000 mL/kg/h | INTRAVENOUS | Status: DC

## 2017-12-02 MED ORDER — SODIUM CHLORIDE 0.9 % WEIGHT BASED INFUSION
3.0000 mL/kg/h | INTRAVENOUS | Status: DC
  Administered 2017-12-02: 3 mL/kg/h via INTRAVENOUS

## 2017-12-02 MED ORDER — ASPIRIN 81 MG PO CHEW: 81.0000 mg | CHEWABLE_TABLET | ORAL | Status: DC

## 2017-12-02 MED ORDER — IOPAMIDOL (ISOVUE-370) INJECTION 76%
INTRAVENOUS | Status: DC | PRN
  Administered 2017-12-02: 70 mL via INTRAVENOUS

## 2017-12-02 MED ORDER — ONDANSETRON HCL 4 MG/2ML IJ SOLN: 4.0000 mg | Freq: Four times a day (QID) | INTRAMUSCULAR | Status: DC | PRN

## 2017-12-02 MED ORDER — SODIUM CHLORIDE 0.9% FLUSH: 3.0000 mL | Freq: Two times a day (BID) | INTRAVENOUS | Status: DC

## 2017-12-02 MED ORDER — HEPARIN (PORCINE) IN NACL 2-0.9 UNIT/ML-% IJ SOLN
INTRAMUSCULAR | Status: AC | PRN
  Administered 2017-12-02: 1000 mL

## 2017-12-02 MED ORDER — FENTANYL CITRATE (PF) 100 MCG/2ML IJ SOLN
INTRAMUSCULAR | Status: AC
  Filled 2017-12-02: qty 2

## 2017-12-02 MED ORDER — HEPARIN SODIUM (PORCINE) 1000 UNIT/ML IJ SOLN
INTRAMUSCULAR | Status: AC
  Filled 2017-12-02: qty 1

## 2017-12-02 MED ORDER — IOPAMIDOL (ISOVUE-370) INJECTION 76%
INTRAVENOUS | Status: AC
  Filled 2017-12-02: qty 125

## 2017-12-02 MED ORDER — SODIUM CHLORIDE 0.9 % IV SOLN: INTRAVENOUS | Status: DC

## 2017-12-02 MED ORDER — HEPARIN SODIUM (PORCINE) 1000 UNIT/ML IJ SOLN
INTRAMUSCULAR | Status: DC | PRN
  Administered 2017-12-02: 5000 [IU] via INTRAVENOUS

## 2017-12-02 SURGICAL SUPPLY — 13 items
CATH INFINITI 5FR ANG PIGTAIL (CATHETERS) ×2 IMPLANT
CATH OPTITORQUE TIG 4.0 5F (CATHETERS) ×2 IMPLANT
COVER PRB 48X5XTLSCP FOLD TPE (BAG) ×1 IMPLANT
COVER PROBE 5X48 (BAG) ×1
DEVICE RAD COMP TR BAND LRG (VASCULAR PRODUCTS) ×2 IMPLANT
GLIDESHEATH SLEND A-KIT 6F 22G (SHEATH) ×2 IMPLANT
GUIDEWIRE INQWIRE 1.5J.035X260 (WIRE) ×1 IMPLANT
INQWIRE 1.5J .035X260CM (WIRE) ×2
KIT HEART LEFT (KITS) ×2 IMPLANT
PACK CARDIAC CATHETERIZATION (CUSTOM PROCEDURE TRAY) ×2 IMPLANT
SYR MEDRAD MARK V 150ML (SYRINGE) ×2 IMPLANT
TRANSDUCER W/STOPCOCK (MISCELLANEOUS) ×2 IMPLANT
TUBING CIL FLEX 10 FLL-RA (TUBING) ×2 IMPLANT

## 2017-12-02 NOTE — Interval H&P Note (Signed)
History and Physical Interval Note:  12/02/2017 1:37 PM  Christina Ayala  has presented today for surgery, with the diagnosis of sob as Angina Equivalent.  The various methods of treatment have been discussed with the patient and family. After consideration of risks, benefits and other options for treatment, the patient has consented to  Procedure(s): LEFT HEART CATH AND CORONARY ANGIOGRAPHY (N/A) with possible Percutaneous Coronary Intervention as a surgical intervention .  The patient's history has been reviewed, patient examined, no change in status, stable for surgery.  I have reviewed the patient's chart and labs.  Questions were answered to the patient's satisfaction.    Cath Lab Visit (complete for each Cath Lab visit)  Clinical Evaluation Leading to the Procedure:   ACS: No.  Non-ACS:    Anginal Classification: CCS III  Anti-ischemic medical therapy: Minimal Therapy (1 class of medications)  Non-Invasive Test Results: No non-invasive testing performed  Prior CABG: No previous CABG     Bryan Lemma

## 2017-12-02 NOTE — Discharge Instructions (Signed)

## 2017-12-03 ENCOUNTER — Encounter (HOSPITAL_COMMUNITY): Payer: Self-pay | Admitting: Cardiology

## 2017-12-03 MED FILL — Verapamil HCl IV Soln 2.5 MG/ML: INTRAVENOUS | Qty: 2 | Status: CN

## 2017-12-03 MED FILL — Verapamil HCl IV Soln 2.5 MG/ML: INTRAVENOUS | Qty: 2 | Status: AC

## 2017-12-16 NOTE — Research (Signed)
CADFEM Informed Consent          Subject Name: Christina Ayala   Subject met inclusion and exclusion criteria.  The informed consent form, study requirements and expectations were reviewed with the subject and questions and concerns were addressed prior to the signing of the consent form.  The subject verbalized understanding of the trial requirements.  The subject agreed to participate in the CADFEM trial and signed the informed consent.  The informed consent was obtained prior to performance of any protocol-specific procedures for the subject.  A copy of the signed informed consent was given to the subject and a copy was placed in the subject's medical record. This patient was consented by Philemon Kingdom on 12-02-17 at 11:00 a.m.    [name] 12-16-16 9:42 a.m.

## 2017-12-26 ENCOUNTER — Ambulatory Visit: Payer: Medicare Other | Admitting: Cardiology

## 2017-12-29 ENCOUNTER — Encounter: Payer: Self-pay | Admitting: Cardiology

## 2017-12-29 ENCOUNTER — Ambulatory Visit (INDEPENDENT_AMBULATORY_CARE_PROVIDER_SITE_OTHER): Payer: Medicare Other | Admitting: Cardiology

## 2017-12-29 VITALS — BP 128/70 | HR 97 | Ht 63.0 in | Wt 230.0 lb

## 2017-12-29 DIAGNOSIS — I493 Ventricular premature depolarization: Secondary | ICD-10-CM | POA: Diagnosis not present

## 2017-12-29 NOTE — Patient Instructions (Signed)
Medication Instructions:  Your physician recommends that you continue on your current medications as directed. Please refer to the Current Medication list given to you today.   Labwork: None  Testing/Procedures: None  Follow-Up: Your physician recommends that you schedule a follow-up appointment in: as needed   Any Other Special Instructions Will Be Listed Below (If Applicable).     If you need a refill on your cardiac medications before your next appointment, please call your pharmacy.   [facility]  Charlee Squibb A, RN, BSN  

## 2017-12-29 NOTE — Progress Notes (Signed)
Cardiology Office Note:    Date:  12/29/2017   ID:  Christina Ayala, DOB January 26, 1951, MRN [id number]  PCP:  Gregor Hams, MD  Cardiologist:  Jenean Lindau, MD   Referring MD: Gregor Hams, MD    ASSESSMENT:    No diagnosis found. PLAN:    In order of problems listed above:  1. Primary prevention stressed with the patient.  Importance of compliance with diet and medications stressed and she vocalized understanding.  Risks of obesity explained in diet was discussed.  She tells me that she has lost significant amount of weight in the past several months and I congratulated her about this.   2. Coronary angiography report was discussed and she is happy with this.  I reassured her about the PVCs.  She will be seen in follow-up appointment on a as needed basis only.   Medication Adjustments/Labs and Tests Ordered: Current medicines are reviewed at length with the patient today.  Concerns regarding medicines are outlined above.  No orders of the defined types were placed in this encounter.  No orders of the defined types were placed in this encounter.    Chief Complaint  Patient presents with  . Follow-up     History of Present Illness:    Christina Ayala is a 67 y.o. female.  The patient has history of rheumatoid arthritis.  She was evaluated because of some shortness of breath issues and frequent PVCs.  She is now here for follow-up.  Coronary angiography has been unremarkable.  She now denies any chest pain and has started walking on a regular basis.  She is morbidly obese and is she is making great strides in losing weight.  Past Medical History:  Diagnosis Date  . Fibromyalgia   . Rheumatoid arthritis ([place])   . Rheumatoid arthritis ([place])   . Thyroid disease     Past Surgical History:  Procedure Laterality Date  . ABDOMINAL HYSTERECTOMY    . CHOLECYSTECTOMY    . LEFT HEART CATH AND CORONARY ANGIOGRAPHY N/A 12/02/2017   Procedure: LEFT HEART CATH AND CORONARY ANGIOGRAPHY;   Surgeon: Leonie Man, MD;  Location: [facility];  Service: Cardiovascular;  Laterality: N/A;  . REPLACEMENT TOTAL KNEE Right   . TONSILLECTOMY    . TYMPANOSTOMY TUBE PLACEMENT      Current Medications: Current Meds  Medication Sig  . Certolizumab Pegol (CIMZIA PREFILLED) 2 X 200 MG/ML KIT Inject 400 mg into the skin every 30 (thirty) days.   . cyclobenzaprine (FLEXERIL) 5 MG tablet Take 1 or 2 every 8 hours as needed for muscle relaxant.  caution: May cause drowsiness  . DULoxetine (CYMBALTA) 30 MG capsule Take 60 mg by mouth daily.   . folic acid (FOLVITE) 1 MG tablet Take 1 mg by mouth daily.   Marland Kitchen HYDROcodone-acetaminophen (NORCO/VICODIN) 5-325 MG tablet Take 0.5 tablets by mouth.  . hydroxychloroquine (PLAQUENIL) 200 MG tablet Take 200 mg by mouth 2 (two) times daily.   Marland Kitchen leflunomide (ARAVA) 20 MG tablet Take 20 mg by mouth daily.  Marland Kitchen levothyroxine (SYNTHROID, LEVOTHROID) 150 MCG tablet Take 1 tablet (150 mcg total) by mouth daily before breakfast.  . methotrexate 50 MG/2ML injection 0.8 cc SQ ONCE a week  . methylPREDNISolone (MEDROL) 2 MG tablet Take 0.5 tablets (1 mg total) by mouth daily.  . Multiple Vitamins-Minerals (MULTIVITAMIN WITH MINERALS) tablet Take 1 tablet by mouth.  . traMADol (ULTRAM) 50 MG tablet Take 25 mg by mouth every 6 (six)  hours as needed for moderate pain.   . traZODone (DESYREL) 100 MG tablet Take 100 mg by mouth at bedtime.   . vitamin B-12 (CYANOCOBALAMIN) 500 MCG tablet Take 500 mcg by mouth daily.      Allergies:   Other; Eggs or egg-derived products; Penicillins; Percocet [oxycodone-acetaminophen]; Spinach; Sulfa antibiotics; and Erythromycin base   Social History   Socioeconomic History  . Marital status: Married    Spouse name: None  . Number of children: None  . Years of education: None  . Highest education level: None  Social Needs  . Financial resource strain: None  . Food insecurity - worry: None  . Food insecurity -  inability: None  . Transportation needs - medical: None  . Transportation needs - non-medical: None  Occupational History  . None  Tobacco Use  . Smoking status: Never Smoker  . Smokeless tobacco: Never Used  Substance and Sexual Activity  . Alcohol use: Yes    Alcohol/week: 0.0 oz  . Drug use: No  . Sexual activity: Not Currently  Other Topics Concern  . None  Social History Narrative  . None     Family History: The patient's family history includes Depression in her brother; Diabetes in her maternal uncle and paternal aunt.  ROS:   Please see the history of present illness.    All other systems reviewed and are negative.  EKGs/Labs/Other Studies Reviewed:    The following studies were reviewed today: I discussed the findings of the coronary angiography with the patient at extensive length and questions were answered to her satisfaction.   Recent Labs: 10/17/2017: ALT 41; TSH 0.53 11/28/2017: BUN 16; Creatinine, Ser 0.76; Hemoglobin 12.4; Platelets 173; Potassium 4.0; Sodium 143  Recent Lipid Panel    Component Value Date/Time   CHOL 175 09/13/2015 1010   TRIG 229 (H) 09/13/2015 1010   HDL 46 09/13/2015 1010   CHOLHDL 3.8 09/13/2015 1010   VLDL 46 (H) 09/13/2015 1010   LDLCALC 83 09/13/2015 1010    Physical Exam:    VS:  BP 128/70 (BP Location: Left Arm, Patient Position: Sitting, Cuff Size: Normal)   Pulse 97   Ht 5' 3" (1.6 m)   Wt 230 lb (104.3 kg)   SpO2 98%   BMI 40.74 kg/m     Wt Readings from Last 3 Encounters:  12/29/17 230 lb (104.3 kg)  12/02/17 225 lb (102.1 kg)  11/28/17 228 lb (103.4 kg)     GEN: Patient is in no acute distress HEENT: Normal NECK: No JVD; No carotid bruits LYMPHATICS: No lymphadenopathy CARDIAC: Hear sounds regular, 2/6 systolic murmur at the apex. RESPIRATORY:  Clear to auscultation without rales, wheezing or rhonchi  ABDOMEN: Soft, non-tender, non-distended MUSCULOSKELETAL:  No edema; No deformity  SKIN: Warm and  dry NEUROLOGIC:  Alert and oriented x 3 PSYCHIATRIC:  Normal affect   Signed, Jenean Lindau, MD  12/29/2017 2:36 PM    [facility]

## 2017-12-30 DIAGNOSIS — M0609 Rheumatoid arthritis without rheumatoid factor, multiple sites: Secondary | ICD-10-CM | POA: Diagnosis not present

## 2018-01-01 ENCOUNTER — Ambulatory Visit (INDEPENDENT_AMBULATORY_CARE_PROVIDER_SITE_OTHER): Payer: Medicare Other | Admitting: Family Medicine

## 2018-01-01 ENCOUNTER — Encounter: Payer: Self-pay | Admitting: Family Medicine

## 2018-01-01 VITALS — BP 133/81 | HR 96 | Ht 63.0 in | Wt 230.0 lb

## 2018-01-01 DIAGNOSIS — I493 Ventricular premature depolarization: Secondary | ICD-10-CM | POA: Diagnosis not present

## 2018-01-01 DIAGNOSIS — M069 Rheumatoid arthritis, unspecified: Secondary | ICD-10-CM | POA: Diagnosis not present

## 2018-01-01 DIAGNOSIS — E039 Hypothyroidism, unspecified: Secondary | ICD-10-CM

## 2018-01-01 DIAGNOSIS — M8580 Other specified disorders of bone density and structure, unspecified site: Secondary | ICD-10-CM | POA: Diagnosis not present

## 2018-01-01 DIAGNOSIS — I209 Angina pectoris, unspecified: Secondary | ICD-10-CM

## 2018-01-01 DIAGNOSIS — R0609 Other forms of dyspnea: Secondary | ICD-10-CM

## 2018-01-01 DIAGNOSIS — E559 Vitamin D deficiency, unspecified: Secondary | ICD-10-CM | POA: Diagnosis not present

## 2018-01-01 DIAGNOSIS — R9431 Abnormal electrocardiogram [ECG] [EKG]: Secondary | ICD-10-CM

## 2018-01-01 DIAGNOSIS — I700 Atherosclerosis of aorta: Secondary | ICD-10-CM

## 2018-01-01 NOTE — Progress Notes (Signed)
Christina Ayala is a 67 y.o. female who presents to [facility] today for follow up heart cath, thyroid and deconditioning.   Heart Cath: Christina Ayala was seen on Jan 10th for chest pains and shortness of breath.  She had a mildly abnormal EKG and was referred to cardiology for further workup.  She had a heart cath in the interim which fortunately showed excellent lead normal clean coronary arteries and a normal ejection fraction.  She was given a clean bill of health by cardiology.  Since then her chest pain resolved with peri-scapular exercises that we provided at the last visit.   Thyroid: Christina Ayala has hypothyroidism and is currently taking levothyroxine.  Her TSH was suppressed in December and her thyroid dose was adjusted.  She feels fine and is due for recheck thyroid labs today.  Deconditioning: One of Christina Ayala's main medical complaints is fatigue and decreased tolerance of exertion.  She attributes this to her rheumatoid arthritis as well as overall joint pain, deconditioning, obesity.  Her goals are to be able to walk a mile without having to stop and to have increased core and leg strength.  She would like to start the [employer] exercise program that she learned is a school child.   Past Medical History:  Diagnosis Date  . Fibromyalgia   . Rheumatoid arthritis ([place])   . Rheumatoid arthritis ([place])   . Thyroid disease    Past Surgical History:  Procedure Laterality Date  . ABDOMINAL HYSTERECTOMY    . CHOLECYSTECTOMY    . LEFT HEART CATH AND CORONARY ANGIOGRAPHY N/A 12/02/2017   Procedure: LEFT HEART CATH AND CORONARY ANGIOGRAPHY;  Surgeon: Leonie Man, MD;  Location: [facility];  Service: Cardiovascular;  Laterality: N/A;  . REPLACEMENT TOTAL KNEE Right   . TONSILLECTOMY    . TYMPANOSTOMY TUBE PLACEMENT     Social History   Tobacco Use  . Smoking  status: Never Smoker  . Smokeless tobacco: Never Used  Substance Use Topics  . Alcohol use: Yes    Alcohol/week: 0.0 oz   family history includes Depression in her brother; Diabetes in her maternal uncle and paternal aunt.  ROS as above:  Medications: Current Outpatient Medications  Medication Sig Dispense Refill  . Certolizumab Pegol (CIMZIA PREFILLED) 2 X 200 MG/ML KIT Inject 400 mg into the skin every 30 (thirty) days.     . cyclobenzaprine (FLEXERIL) 5 MG tablet Take 1 or 2 every 8 hours as needed for muscle relaxant.  caution: May cause drowsiness 30 tablet 0  . DULoxetine (CYMBALTA) 30 MG capsule Take 60 mg by mouth daily.     . folic acid (FOLVITE) 1 MG tablet Take 1 mg by mouth daily.     Marland Kitchen HYDROcodone-acetaminophen (NORCO/VICODIN) 5-325 MG tablet Take 0.5 tablets by mouth.    . hydroxychloroquine (PLAQUENIL) 200 MG tablet Take 200 mg by mouth 2 (two) times daily.     Marland Kitchen leflunomide (ARAVA) 20 MG tablet Take 20 mg by mouth daily.    Marland Kitchen levothyroxine (SYNTHROID, LEVOTHROID) 150 MCG tablet Take 1 tablet (150 mcg total) by mouth daily before breakfast. 90 tablet 1  . methotrexate 50 MG/2ML injection 0.8 cc SQ ONCE a week    . methylPREDNISolone (MEDROL) 2 MG tablet Take 0.5 tablets (1 mg total) by mouth daily. 45 tablet 0  . Multiple Vitamins-Minerals (MULTIVITAMIN WITH MINERALS) tablet Take 1 tablet by mouth.    Marland Kitchen  traMADol (ULTRAM) 50 MG tablet Take 25 mg by mouth every 6 (six) hours as needed for moderate pain.     . traZODone (DESYREL) 100 MG tablet Take 100 mg by mouth at bedtime.     . vitamin B-12 (CYANOCOBALAMIN) 500 MCG tablet Take 500 mcg by mouth daily.      No current facility-administered medications for this visit.    Allergies  Allergen Reactions  . Other Anaphylaxis    portabella and cremini mushrooms.  Peppers (Any kind)  . Eggs Or Egg-Derived Products   . Penicillins Nausea And Vomiting    Can take keflex  . Percocet [Oxycodone-Acetaminophen] Other (See  Comments)    Mood changes, vision changes.   Marland Kitchen Spinach   . Sulfa Antibiotics Nausea And Vomiting  . Erythromycin Base Nausea Only    Health Maintenance Health Maintenance  Topic Date Due  . MAMMOGRAM  11/11/2048 (Originally 04/21/2001)  . COLONOSCOPY  09/12/2018  . TETANUS/TDAP  09/12/2025  . DEXA SCAN  Completed  . Hepatitis C Screening  Completed     Exam:  BP 133/81   Pulse 96   Ht _0  (1.6 m)   Wt 230 lb (104.3 kg)   BMI 40.74 kg/m  Gen: Well NAD obese HEENT: EOMI,  MMM Lungs: Normal work of breathing. CTABL Heart: RRR no MRG Abd: NABS, Soft. Nondistended, Nontender Exts: Brisk capillary refill, warm and well perfused.   Summary of cardiac catheterization report:  Angiographically normal coronary arteries  The left ventricular systolic function is normal.  LV end diastolic pressure is normal.  The left ventricular ejection fraction is 55-65% by visual estimate.      Chemistry      Component Value Date/Time   NA 143 11/28/2017 1141   K 4.0 11/28/2017 1141   CL 107 (H) 11/28/2017 1141   CO2 21 11/28/2017 1141   BUN 16 11/28/2017 1141   CREATININE 0.76 11/28/2017 1141   CREATININE 0.90 10/17/2017 1357   GLU 92 03/26/2016      Component Value Date/Time   CALCIUM 8.9 11/28/2017 1141   ALKPHOS 72 02/28/2017 1208   AST 41 (H) 10/17/2017 1357   ALT 41 (H) 10/17/2017 1357   BILITOT 0.4 10/17/2017 1357     Lab Results  Component Value Date   TSH 0.53 10/17/2017      Assessment and Plan: 67 y.o. female with  Coronary arteries: Doing well.  Normal clean coronary arteries on cardiac catheterization.  Will continue watchful waiting and recheck as needed.  Hypothyroidism: Due for recheck TSH today.  Will obtain TSH and adjust levothyroxine as needed.  Deconditioning: Likely main cause of shortness of breath.  We discussed exercise program.  Plan to start a graduated [employer] exercise program which involves stretching sit ups back  extension push-ups and walking in place.  Additionally recommend starting a walking program as well.  Goal to start walking quarter miles to half miles to full 1 miles.  Goal be able to walk 1 mile without stopping and be able to stand up from a seated position on the floor.  History of vitamin D deficiency: We will check vitamin D as well as metabolic panel to follow-up vitamin D deficiency and osteopenia noted previously.  Physicians: Patient is allergic to pneumonia and influenza vaccines.  This was addressed in health maintenance. Patient declines mammograms.   Orders Placed This Encounter  Procedures  . COMPLETE METABOLIC PANEL WITH GFR  . TSH  . VITAMIN D  25 Hydroxy (Vit-D Deficiency, Fractures)   No orders of the defined types were placed in this encounter.    Discussed warning signs or symptoms. Please see discharge instructions. Patient expresses understanding.

## 2018-01-01 NOTE — Patient Instructions (Signed)
Thank you for coming in today. Get labs  Recheck every 6 months or sooner if needed.  Work on exercise. Titrate your exercise by about 10% per week.  Goal being able to walk 1 mile without stopping.  Goal being able to get up off the floor without furniture.

## 2018-01-02 ENCOUNTER — Other Ambulatory Visit: Payer: Self-pay | Admitting: Family Medicine

## 2018-01-02 LAB — COMPLETE METABOLIC PANEL WITH GFR
AG Ratio: 1.6 (calc) (ref 1.0–2.5)
ALT: 21 U/L (ref 6–29)
AST: 25 U/L (ref 10–35)
Albumin: 3.5 g/dL — ABNORMAL LOW (ref 3.6–5.1)
Alkaline phosphatase (APISO): 80 U/L (ref 33–130)
BUN: 18 mg/dL (ref 7–25)
CALCIUM: 8.6 mg/dL (ref 8.6–10.4)
CO2: 24 mmol/L (ref 20–32)
CREATININE: 0.74 mg/dL (ref 0.50–0.99)
Chloride: 107 mmol/L (ref 98–110)
GFR, Est African American: 98 mL/min/{1.73_m2} (ref >=60)
GFR, Est Non African American: 84 mL/min/{1.73_m2} (ref >=60)
GLUCOSE: 105 mg/dL — AB (ref 65–99)
Globulin: 2.2 g/dL (calc) (ref 1.9–3.7)
Potassium: 4 mmol/L (ref 3.5–5.3)
Sodium: 140 mmol/L (ref 135–146)
Total Bilirubin: 0.4 mg/dL (ref 0.2–1.2)
Total Protein: 5.7 g/dL — ABNORMAL LOW (ref 6.1–8.1)

## 2018-01-02 LAB — VITAMIN D 25 HYDROXY (VIT D DEFICIENCY, FRACTURES): VIT D 25 HYDROXY: 19 ng/mL — AB (ref 30–100)

## 2018-01-02 LAB — TSH: TSH: 1.46 m[IU]/L (ref 0.40–4.50)

## 2018-01-02 MED ORDER — LEVOTHYROXINE SODIUM 150 MCG PO TABS: 150.0000 ug | ORAL_TABLET | Freq: Every day | ORAL | 1 refills | Status: DC

## 2018-01-27 DIAGNOSIS — M9903 Segmental and somatic dysfunction of lumbar region: Secondary | ICD-10-CM | POA: Diagnosis not present

## 2018-01-27 DIAGNOSIS — M0609 Rheumatoid arthritis without rheumatoid factor, multiple sites: Secondary | ICD-10-CM | POA: Diagnosis not present

## 2018-01-27 DIAGNOSIS — M9905 Segmental and somatic dysfunction of pelvic region: Secondary | ICD-10-CM | POA: Diagnosis not present

## 2018-01-27 DIAGNOSIS — M47816 Spondylosis without myelopathy or radiculopathy, lumbar region: Secondary | ICD-10-CM | POA: Diagnosis not present

## 2018-01-27 DIAGNOSIS — M62830 Muscle spasm of back: Secondary | ICD-10-CM | POA: Diagnosis not present

## 2018-01-28 ENCOUNTER — Ambulatory Visit (INDEPENDENT_AMBULATORY_CARE_PROVIDER_SITE_OTHER): Payer: Medicare Other | Admitting: Family Medicine

## 2018-01-28 ENCOUNTER — Encounter: Payer: Self-pay | Admitting: Family Medicine

## 2018-01-28 VITALS — BP 135/70 | HR 96 | Ht 63.0 in | Wt 231.0 lb

## 2018-01-28 DIAGNOSIS — M7041 Prepatellar bursitis, right knee: Secondary | ICD-10-CM

## 2018-01-28 DIAGNOSIS — I209 Angina pectoris, unspecified: Secondary | ICD-10-CM

## 2018-01-28 MED ORDER — DICLOFENAC SODIUM 1 % TD GEL: 4.0000 g | Freq: Four times a day (QID) | TRANSDERMAL | 11 refills | Status: AC

## 2018-01-28 NOTE — Patient Instructions (Signed)
Thank you for coming in today. Apply the voltaren gel to your knee 4x daily.  Continue Ice.  Use the gentle ankle weight knee going from straight to bent slowly exercises.  Do about 15-30 reps 2-3x daily.  Do the straight leg raise exercises 15-30 reps 1-2 x daily.   If not better let me know.    Prepatellar Bursitis Prepatellar bursitis is inflammation of the prepatellar bursa. The prepatellar bursa is a fluid-filled sac that cushions the kneecap (patella). Prepatellar bursitis happens when fluid builds up in the this sac and causes it to get larger. The condition causes knee pain. What are the causes? This condition may be caused by:  Constant pressure on the knees from kneeling.  A hit to the knee.  Falling on the knee.  A bacterial infection.  Moving the knee often in a forceful way.  What increases the risk? This condition is more likely to develop in:  People who play a sport that involves a risk for falls on the knee or hard hits (blows) to the knee. These sports include: ? Football. ? Wrestling. ? Basketball. ? Soccer.  People who have to kneel for long periods of time, such as roofers, plumbers, and gardeners.  People with another inflammatory condition, such as gout or rheumatoid arthritis.  What are the signs or symptoms? The most common symptom of this condition is knee pain that gets better with rest. Other symptoms include:  Swelling on the front of the kneecap.  Warmth in the knee.  Tenderness with activity.  Redness in the knee.  Inability to bend the knee or to kneel.  How is this diagnosed? This condition may be diagnosed based on:  Your symptoms.  Your medical history.  A physical exam. During the exam, your provider will compare your knees and check for tenderness and pain when moving your knee. Your health care provider may also use a needle to remove fluid from the bursa to help diagnose an infection.  Tests, such as: ? A blood test  that checks for infection. ? X-rays. These may be taken to check the structure of the patella. ? MRI or ultrasound. These may be done to check for swelling and fluid buildup in the bursa.  How is this treated? This condition may be treated by:  Resting the knee.  Applying ice to the knee.  Medicine, such as: ? Nonsteroidal anti-inflammatory drugs (NSAIDs). These can help to reduce pain and swelling. ? Antibiotic medicines. These may be needed if you have an infection. ? Steroid medicines. These may be prescribed if other treatments are not helping.  Raising (elevating) the knee while resting.  Doing strengthening and stretching exercises (physical therapy). These may be recommended after pain and swelling improve.  Having a procedure to remove fluid from the bursa. This may be done if other treatment is not helping.  Having surgery to remove the bursa. This may be done if you have a severe infection or if the condition keeps coming back after treatment.  Follow these instructions at home: Medicines  Take over-the-counter and prescription medicines only as told by your health care provider.  If you were prescribed an antibiotic medicine, take it as told by your health care provider. Do not stop taking the antibiotic even if you start to feel better. Managing pain, stiffness, and swelling  If directed, apply ice to your knee. ? Put ice in a plastic bag. ? Place a towel between your skin and the bag. ?  Leave the ice on for 20 minutes, 2-3 times a day.  Elevate your knee above the level of your heart while you are sitting or lying down. Driving  Do not drive or operate heavy machinery while taking prescription pain medicine.  Ask your health care provider when it is safe fpr you to drive. Activity  Rest your knee.  Avoid activities that cause pain.  Return to your normal activities as told by your health care provider. Ask your health care provider what activities are  safe for you.  Do exercises as told by your health care provider. General instructions  Do not use the injured limb to support your body weight until your health care provider says that you can.  Do not use any tobacco products, such as cigarettes, chewing tobacco, and e-cigarettes. Tobacco can delay bone healing. If you need help quitting, ask your health care provider.  Keep all follow-up visits as told by your health care provider. This is important. How is this prevented?  Warm up and stretch before being active.  Cool down and stretch after being active.  Give your body time to rest between periods of activity.  Make sure to use equipment that fits you.  Be safe and responsible while being active to avoid falls.  Do at least 150 minutes of moderate-intensity exercise each week, such as brisk walking or water aerobics.  Maintain physical fitness, including: ? Strength. ? Flexibility. ? Cardiovascular fitness. ? Endurance. Contact a health care provider if:  Your symptoms do not improve.  Your symptoms get worse.  Your symptoms keep coming back after treatment.  You develop a fever and have warmth, redness, and swelling over your knee. This information is not intended to replace advice given to you by your health care provider. Make sure you discuss any questions you have with your health care provider. Document Released: 10/28/2005 Document Revised: 07/02/2016 Document Reviewed: 07/28/2015 Elsevier Interactive Patient Education  2018 [employer].    Prepatellar Bursitis Rehab Ask your health care provider which exercises are safe for you. Do exercises exactly as told by your health care provider and adjust them as directed. It is normal to feel mild stretching, pulling, tightness, or discomfort as you do these exercises, but you should stop right away if you feel sudden pain or your pain gets worse.Do not begin these exercises until told by your health care  provider. Stretching and range of motion exercises These exercises warm up your muscles and joints and improve the movement and flexibility of your knee. These exercises also help to relieve pain, numbness, and tingling. Exercise A: Hamstring, standing  1. Stand with your __________ foot resting on a chair. Your __________ leg should be fully extended. 2. Arch your lower back slightly. 3. Leading with your chest, lean forward at the waist until you feel a gentle stretch in the back of your __________ knee or in your thigh. You should not need to lean far to feel a stretch. 4. Hold this position for __________ seconds. Repeat __________ times. Complete this stretch __________ times a day. Exercise B: Knee flexion, active heel slides 1. Lie on your back with both knees straight. If this causes back discomfort, bend your __________ knee so your foot is flat on the floor. 2. Slowly slide your __________ heel back toward your buttocks until you feel a gentle stretch in the front of your knee or thigh. 3. Hold this position for __________ seconds. 4. Slowly slide your __________ heel back  to the starting position. Repeat __________ times. Complete this stretch __________ times a day. Strengthening exercises These exercises build strength and endurance in your knee. Endurance is the ability to use your muscles for a long time, even after they get tired. Exercise C: Quadriceps, isometric  1. Lie on your back with your __________ leg extended and your __________ knee bent. 2. Slowly tense the muscles in the front of your __________ thigh. When you do this, you should see your kneecap slide up toward your hip or see increased dimpling just above the knee. This motion will push the back of your knee down toward the surface that is under it. 3. For __________ seconds, keep the muscle as tight as you can without increasing your pain. 4. Relax the muscles slowly and completely. Repeat __________ times.  Complete this exercise __________ times a day. Exercise D: Straight leg raises ( quadriceps) 1. Lie on your back with your __________ leg extended and your __________ knee bent. 2. Slowly tense the muscles in your __________ thigh. When you do this, you should see your kneecap slide up toward your hip or see increased dimpling just above the knee. 3. Keep these muscles tight as you raise your leg 4-6 inches (10-15 cm) off the floor. 4. Hold this position for __________ seconds. 5. Keep these muscles tense as you lower your leg slowly. 6. Relax your muscles slowly and completely. Repeat __________ times. Complete this exercise __________ times a day. Exercise E: Straight leg raises ( hip extensors) 1. Lie on your abdomen on a bed or a firm surface. You can put a pillow under your hips if that is more comfortable. 2. Tense your buttock muscles and lift your __________ thigh. Your __________ knee can be bent or straight, but do not let your back arch. 3. Hold this position for __________ seconds. 4. Slowly lower your leg to the starting position. 5. Let your muscles relax completely. Repeat __________ times. Complete this exercise __________ times a day. This information is not intended to replace advice given to you by your health care provider. Make sure you discuss any questions you have with your health care provider. Document Released: 10/28/2005 Document Revised: 07/02/2016 Document Reviewed: 07/28/2015 Elsevier Interactive Patient Education  [employer].

## 2018-01-28 NOTE — Progress Notes (Signed)
Christina Ayala is a 67 y.o. female who presents to [facility] [place] today for right anterior knee pain. Alorah notes moderate to severe right anterior knee pain starting today.  She has a history of right total knee replacement.  Additionally she has been noting some bilateral hip pain and had some adjustments with a chiropractor which did help.  She was doing pretty well with only minimal pain today when she was sitting down for a longer period of time to complete a sewing project.  She stood up she felt immediate pain at the anterior aspect of the knee.  She has pain with standing from a seated position, climbing stairs, and with pressure at the anterior knee.  She notes that she had a small bump at the anterior knee which is now gone.  She is not sure if this correlates with her pain at all.  She is tried some over-the-counter medicines for pain which have not helped.  No fevers or chills.  No trauma or injury.  No significant change in activity.   Past Medical History:  Diagnosis Date  . Fibromyalgia   . Rheumatoid arthritis ([place])   . Rheumatoid arthritis ([place])   . Thyroid disease    Past Surgical History:  Procedure Laterality Date  . ABDOMINAL HYSTERECTOMY    . CHOLECYSTECTOMY    . LEFT HEART CATH AND CORONARY ANGIOGRAPHY N/A 12/02/2017   Procedure: LEFT HEART CATH AND CORONARY ANGIOGRAPHY;  Surgeon: Leonie Man, MD;  Location: [facility];  Service: Cardiovascular;  Laterality: N/A;  . REPLACEMENT TOTAL KNEE Right   . TONSILLECTOMY    . TYMPANOSTOMY TUBE PLACEMENT     Social History   Tobacco Use  . Smoking status: Never Smoker  . Smokeless tobacco: Never Used  Substance Use Topics  . Alcohol use: Yes    Alcohol/week: 0.0 oz   family history includes Depression in her brother; Diabetes in her maternal uncle and paternal aunt.  ROS as  above:  Medications: Current Outpatient Medications  Medication Sig Dispense Refill  . Certolizumab Pegol (CIMZIA PREFILLED) 2 X 200 MG/ML KIT Inject 400 mg into the skin every 30 (thirty) days.     . cyclobenzaprine (FLEXERIL) 5 MG tablet Take 1 or 2 every 8 hours as needed for muscle relaxant.  caution: May cause drowsiness 30 tablet 0  . DULoxetine (CYMBALTA) 30 MG capsule Take 60 mg by mouth daily.     . folic acid (FOLVITE) 1 MG tablet Take 1 mg by mouth daily.     Marland Kitchen HYDROcodone-acetaminophen (NORCO/VICODIN) 5-325 MG tablet Take 0.5 tablets by mouth.    . hydroxychloroquine (PLAQUENIL) 200 MG tablet Take 200 mg by mouth 2 (two) times daily.     Marland Kitchen leflunomide (ARAVA) 20 MG tablet Take 20 mg by mouth daily.    Marland Kitchen levothyroxine (SYNTHROID, LEVOTHROID) 150 MCG tablet Take 1 tablet (150 mcg total) by mouth daily before breakfast. 90 tablet 1  . methotrexate 50 MG/2ML injection 0.8 cc SQ ONCE a week    . methylPREDNISolone (MEDROL) 2 MG tablet Take 0.5 tablets (1 mg total) by mouth daily. 45 tablet 0  . Multiple Vitamins-Minerals (MULTIVITAMIN WITH MINERALS) tablet Take 1 tablet by mouth.    . traMADol (ULTRAM) 50 MG tablet Take 25 mg by mouth every 6 (six) hours as needed for moderate pain.     . traZODone (DESYREL) 100 MG tablet Take 100 mg by mouth  at bedtime.     . vitamin B-12 (CYANOCOBALAMIN) 500 MCG tablet Take 500 mcg by mouth daily.     . diclofenac sodium (VOLTAREN) 1 % GEL Apply 4 g topically 4 (four) times daily. To affected joint. 100 g 11   No current facility-administered medications for this visit.    Allergies  Allergen Reactions  . Other Anaphylaxis    portabella and cremini mushrooms.  Peppers (Any kind)  . Eggs Or Egg-Derived Products   . Penicillins Nausea And Vomiting    Can take keflex  . Percocet [Oxycodone-Acetaminophen] Other (See Comments)    Mood changes, vision changes.   Marland Kitchen Spinach   . Sulfa Antibiotics Nausea And Vomiting  . Erythromycin Base Nausea Only     Health Maintenance Health Maintenance  Topic Date Due  . MAMMOGRAM  11/11/2048 (Originally 04/21/2001)  . COLONOSCOPY  09/12/2018  . TETANUS/TDAP  09/12/2025  . DEXA SCAN  Completed  . Hepatitis C Screening  Completed     Exam:  BP 135/70   Pulse 96   Ht 5' 3"  (1.6 m)   Wt 231 lb (104.8 kg)   BMI 40.92 kg/m  Gen: Well NAD Right knee:  Well-appearing with no effusion or erythema.  Well appearing medial longitudinal scar from total knee replacement. Range of motion 0-120 degrees with no significant retropatellar crepitations Tender to palpation with palpable squeak overlying the patella.  Palpable squeak without tenderness present at the quad tendon insertion onto the patella.  Mildly tenderness without squeak at the patella tendon itself. The knee is otherwise nontender with stable ligamentous exam. Patient has intact extension strength without pain during the extension component of the test.  However with let off of extension force she developed pain.  X-ray right knee images from May 2017 independently reviewed showing well appearing surgical hardware from total knee replacement.   Musculoskeletal ultrasound of the right anterior knee revealed intact quad tendon and patella tendon.  The patella itself is normal-appearing. However there is hypoechoic fluid increased vascular activity tracing superficially to the patella  Impression: Consistent in appearance with prepatellar bursitis.  Assessment and Plan: 67 y.o. female with right knee prepatellar bursitis.  Unclear of the cause.  Rowen does have rheumatologic disease which possibly could be a contributor here.  Additionally if she had some hip pain she may have been limping which may also have predisposed this condition.  Plan for treatment with ice eccentric exercises and diclofenac gel.  If not better recheck in the near future.  This is a new problem with uncertain prognosis.  Diagnosis is complicated by her medical  history and her surgical history of right total knee replacement.    No orders of the defined types were placed in this encounter.  Meds ordered this encounter  Medications  . diclofenac sodium (VOLTAREN) 1 % GEL    Sig: Apply 4 g topically 4 (four) times daily. To affected joint.    Dispense:  100 g    Refill:  11     Discussed warning signs or symptoms. Please see discharge instructions. Patient expresses understanding.

## 2018-02-24 DIAGNOSIS — M797 Fibromyalgia: Secondary | ICD-10-CM | POA: Diagnosis not present

## 2018-02-24 DIAGNOSIS — M150 Primary generalized (osteo)arthritis: Secondary | ICD-10-CM | POA: Diagnosis not present

## 2018-02-24 DIAGNOSIS — M8580 Other specified disorders of bone density and structure, unspecified site: Secondary | ICD-10-CM | POA: Diagnosis not present

## 2018-02-24 DIAGNOSIS — M0609 Rheumatoid arthritis without rheumatoid factor, multiple sites: Secondary | ICD-10-CM | POA: Diagnosis not present

## 2018-02-24 DIAGNOSIS — Z6841 Body Mass Index (BMI) 40.0 and over, adult: Secondary | ICD-10-CM | POA: Diagnosis not present

## 2018-02-24 LAB — CBC AND DIFFERENTIAL
HCT: 40 (ref 36–46)
Hemoglobin: 13 (ref 12.0–16.0)
MCV: 101
Platelets: 175 (ref 150–399)

## 2018-02-24 LAB — BASIC METABOLIC PANEL
Albumin: 3.8
BUN/Creatinine Ratio: 18
BUN: 14 (ref 4–21)
CO2: 22
Calcium: 8.8
Chloride: 106
Creatinine: 0.8 (ref 0.5–1.1)
EGFR (Non-African Amer.): 79
GLUCOSE: 87
Potassium: 4.5 (ref 3.4–5.3)
Sodium: 143 (ref 137–147)
Total Protein: 6

## 2018-02-24 LAB — HEPATIC FUNCTION PANEL
ALT: 21 (ref 7–35)
AST: 30 (ref 13–35)
Alkaline Phosphatase: 90 (ref 25–125)
Bilirubin, Total: 0.3

## 2018-02-25 DIAGNOSIS — M0609 Rheumatoid arthritis without rheumatoid factor, multiple sites: Secondary | ICD-10-CM | POA: Diagnosis not present

## 2018-03-03 ENCOUNTER — Telehealth: Payer: Self-pay

## 2018-03-03 MED ORDER — EPINEPHRINE 0.3 MG/0.3ML IJ SOAJ: 0.3000 mg | Freq: Once | INTRAMUSCULAR | 1 refills | Status: AC

## 2018-03-03 NOTE — Telephone Encounter (Signed)
PT needs a refill on her Epi Pen the one she had is expired. Please update pharmacy information also [facility] [address] [facility]

## 2018-03-03 NOTE — Telephone Encounter (Signed)
Do not see on active Rx list. Will route for approval.

## 2018-03-03 NOTE — Telephone Encounter (Signed)
EpiPen refilled 

## 2018-03-03 NOTE — Telephone Encounter (Signed)
Pt advised.

## 2018-03-17 ENCOUNTER — Encounter: Payer: Self-pay | Admitting: Family Medicine

## 2018-04-09 DIAGNOSIS — M0609 Rheumatoid arthritis without rheumatoid factor, multiple sites: Secondary | ICD-10-CM | POA: Diagnosis not present

## 2018-05-07 DIAGNOSIS — M0609 Rheumatoid arthritis without rheumatoid factor, multiple sites: Secondary | ICD-10-CM | POA: Diagnosis not present

## 2018-05-07 DIAGNOSIS — Z79899 Other long term (current) drug therapy: Secondary | ICD-10-CM | POA: Diagnosis not present

## 2018-05-21 DIAGNOSIS — M150 Primary generalized (osteo)arthritis: Secondary | ICD-10-CM | POA: Diagnosis not present

## 2018-05-21 DIAGNOSIS — M797 Fibromyalgia: Secondary | ICD-10-CM | POA: Diagnosis not present

## 2018-05-21 DIAGNOSIS — Z6841 Body Mass Index (BMI) 40.0 and over, adult: Secondary | ICD-10-CM | POA: Diagnosis not present

## 2018-05-21 DIAGNOSIS — M0609 Rheumatoid arthritis without rheumatoid factor, multiple sites: Secondary | ICD-10-CM | POA: Diagnosis not present

## 2018-05-21 DIAGNOSIS — M8580 Other specified disorders of bone density and structure, unspecified site: Secondary | ICD-10-CM | POA: Diagnosis not present

## 2018-06-04 DIAGNOSIS — M0609 Rheumatoid arthritis without rheumatoid factor, multiple sites: Secondary | ICD-10-CM | POA: Diagnosis not present

## 2018-07-07 DIAGNOSIS — M0609 Rheumatoid arthritis without rheumatoid factor, multiple sites: Secondary | ICD-10-CM | POA: Diagnosis not present

## 2018-07-22 DIAGNOSIS — H2513 Age-related nuclear cataract, bilateral: Secondary | ICD-10-CM | POA: Diagnosis not present

## 2018-07-22 DIAGNOSIS — H527 Unspecified disorder of refraction: Secondary | ICD-10-CM | POA: Diagnosis not present

## 2018-08-04 DIAGNOSIS — M0609 Rheumatoid arthritis without rheumatoid factor, multiple sites: Secondary | ICD-10-CM | POA: Diagnosis not present

## 2018-08-17 DIAGNOSIS — M542 Cervicalgia: Secondary | ICD-10-CM | POA: Diagnosis not present

## 2018-08-17 DIAGNOSIS — M9902 Segmental and somatic dysfunction of thoracic region: Secondary | ICD-10-CM | POA: Diagnosis not present

## 2018-08-17 DIAGNOSIS — M9901 Segmental and somatic dysfunction of cervical region: Secondary | ICD-10-CM | POA: Diagnosis not present

## 2018-08-17 DIAGNOSIS — M5413 Radiculopathy, cervicothoracic region: Secondary | ICD-10-CM | POA: Diagnosis not present

## 2018-08-20 DIAGNOSIS — M5413 Radiculopathy, cervicothoracic region: Secondary | ICD-10-CM | POA: Diagnosis not present

## 2018-08-20 DIAGNOSIS — M9902 Segmental and somatic dysfunction of thoracic region: Secondary | ICD-10-CM | POA: Diagnosis not present

## 2018-08-20 DIAGNOSIS — M542 Cervicalgia: Secondary | ICD-10-CM | POA: Diagnosis not present

## 2018-08-20 DIAGNOSIS — M9901 Segmental and somatic dysfunction of cervical region: Secondary | ICD-10-CM | POA: Diagnosis not present

## 2018-08-21 DIAGNOSIS — M0609 Rheumatoid arthritis without rheumatoid factor, multiple sites: Secondary | ICD-10-CM | POA: Diagnosis not present

## 2018-08-21 DIAGNOSIS — M797 Fibromyalgia: Secondary | ICD-10-CM | POA: Diagnosis not present

## 2018-08-21 DIAGNOSIS — Z6841 Body Mass Index (BMI) 40.0 and over, adult: Secondary | ICD-10-CM | POA: Diagnosis not present

## 2018-08-21 DIAGNOSIS — M150 Primary generalized (osteo)arthritis: Secondary | ICD-10-CM | POA: Diagnosis not present

## 2018-08-21 DIAGNOSIS — M8580 Other specified disorders of bone density and structure, unspecified site: Secondary | ICD-10-CM | POA: Diagnosis not present

## 2018-08-24 DIAGNOSIS — M9901 Segmental and somatic dysfunction of cervical region: Secondary | ICD-10-CM | POA: Diagnosis not present

## 2018-08-24 DIAGNOSIS — M542 Cervicalgia: Secondary | ICD-10-CM | POA: Diagnosis not present

## 2018-08-24 DIAGNOSIS — M5413 Radiculopathy, cervicothoracic region: Secondary | ICD-10-CM | POA: Diagnosis not present

## 2018-08-24 DIAGNOSIS — M9902 Segmental and somatic dysfunction of thoracic region: Secondary | ICD-10-CM | POA: Diagnosis not present

## 2018-09-01 DIAGNOSIS — M0609 Rheumatoid arthritis without rheumatoid factor, multiple sites: Secondary | ICD-10-CM | POA: Diagnosis not present

## 2018-09-29 DIAGNOSIS — M0609 Rheumatoid arthritis without rheumatoid factor, multiple sites: Secondary | ICD-10-CM | POA: Diagnosis not present

## 2018-09-29 DIAGNOSIS — Z79899 Other long term (current) drug therapy: Secondary | ICD-10-CM | POA: Diagnosis not present

## 2018-10-16 IMAGING — CT CT L SPINE W/O CM
3 series · 10 of 33 positions shown, 12 images · non-contrast
Comparison: None.

CLINICAL DATA: 65-year-old female with severe back pain.

EXAM:
CT LUMBAR SPINE WITHOUT CONTRAST
TECHNIQUE: Multidetector CT imaging of the lumbar spine was performed without
intravenous contrast administration. Multiplanar CT image
reconstructions were also generated.

[Series 5: l spine soft · axial · 0.30mm/px · z∈[-323,-183]mm · 2 of 149 slices shown, 3 images]
[im 46/149  soft-tissue]
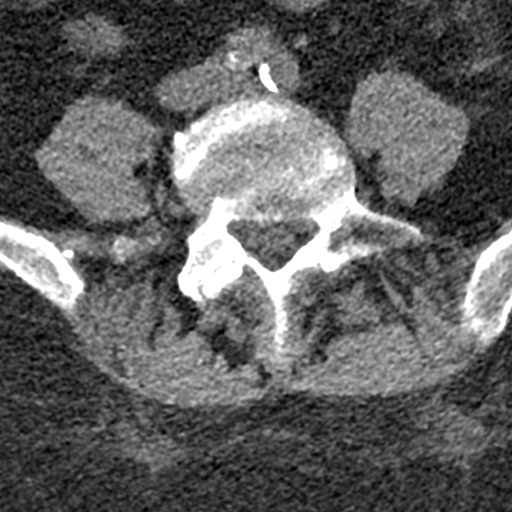
[im 46/149  bone]
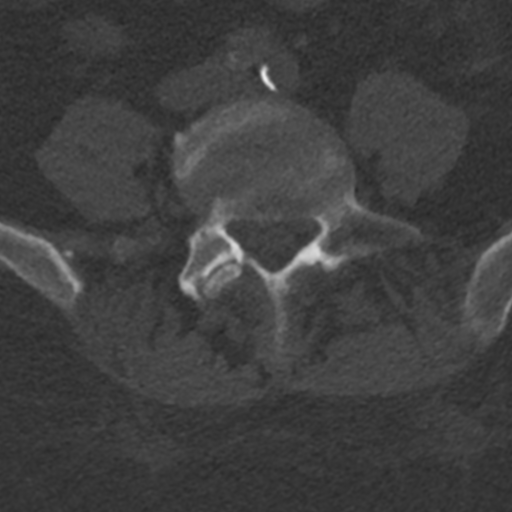
[im 114/149  bone]
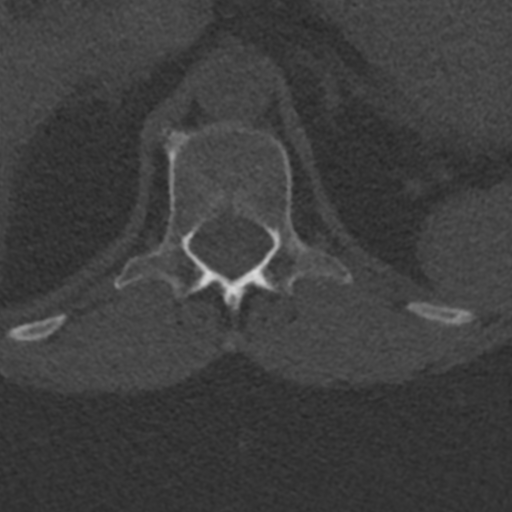

[Series 6: sagittal bone · sagittal · 0.29mm/px · 5 of 61 slices shown, 6 images]
[im 21/61  bone]
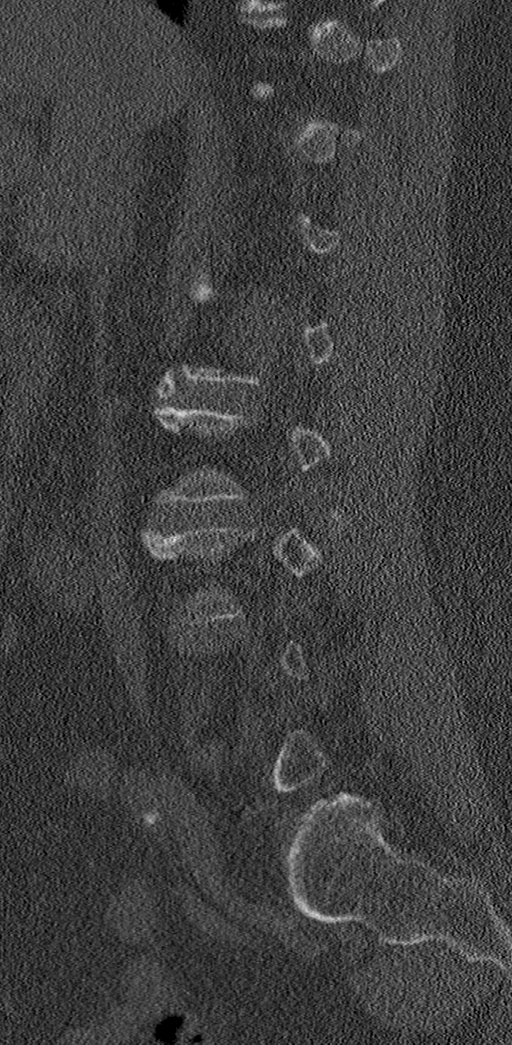
[im 26/61  bone]
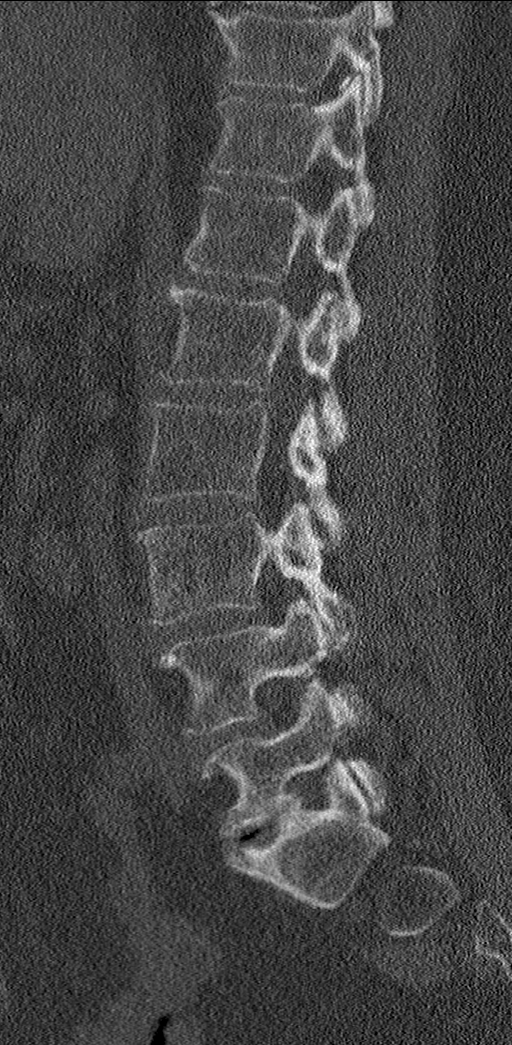
[im 31/61  soft-tissue]
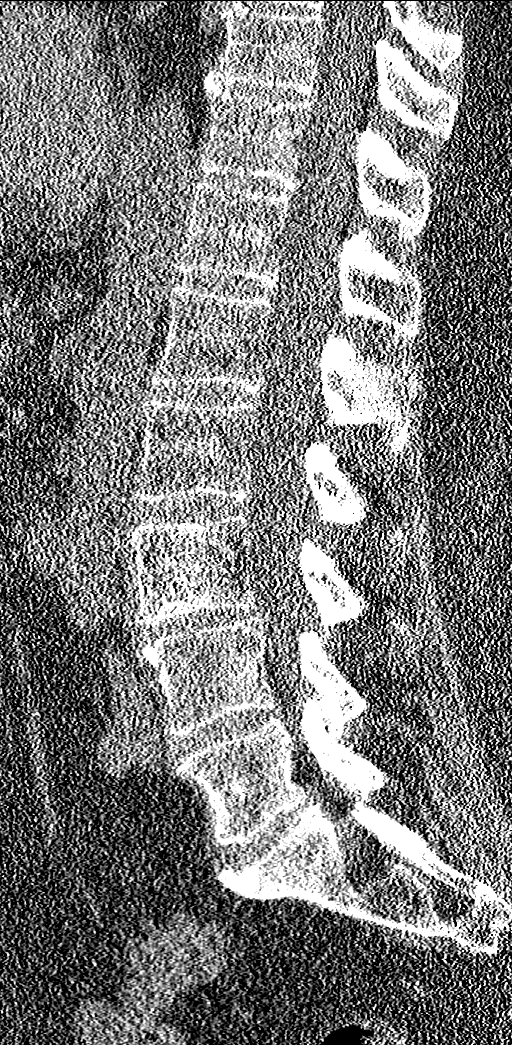
[im 31/61  bone]
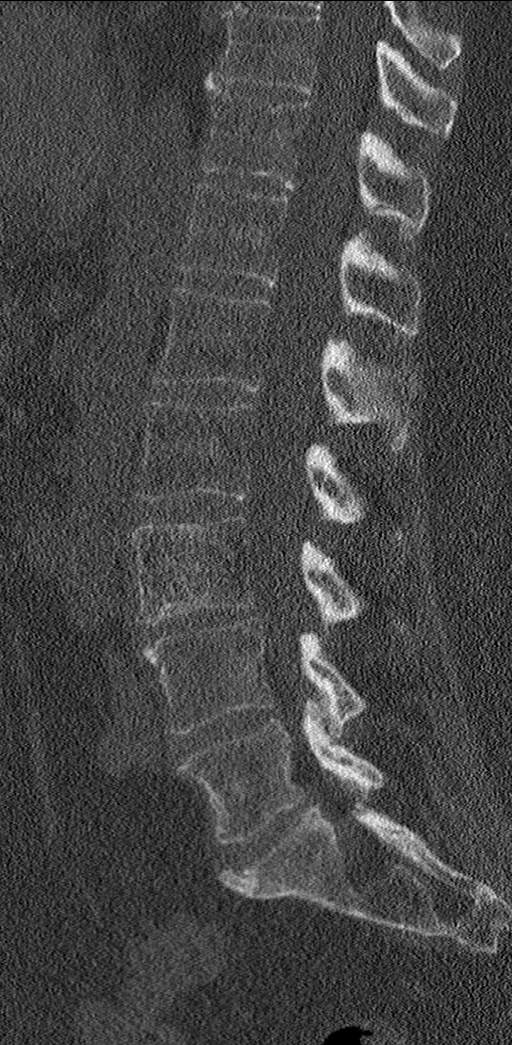
[im 36/61  bone]
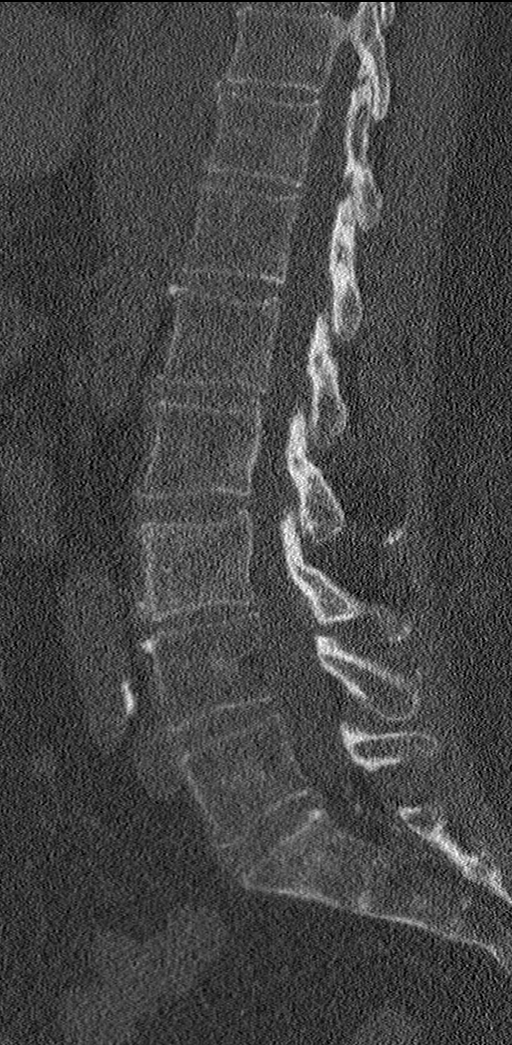
[im 41/61  bone]
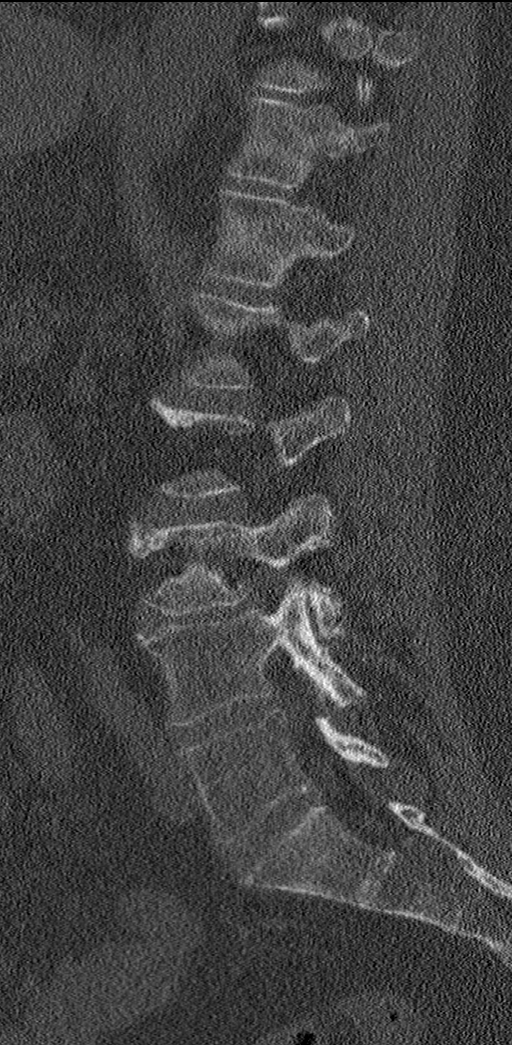

[Series 7: coronal bone · coronal · 0.40mm/px · 3 of 71 slices shown]
[im 15/71  bone]
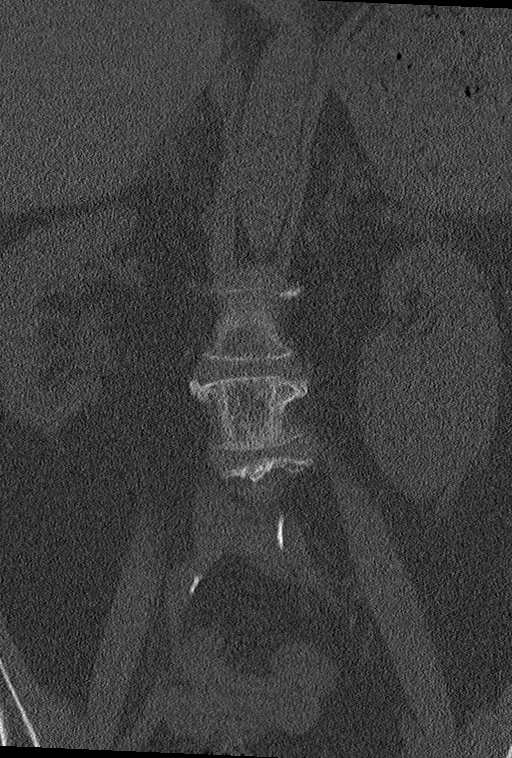
[im 29/71  bone]
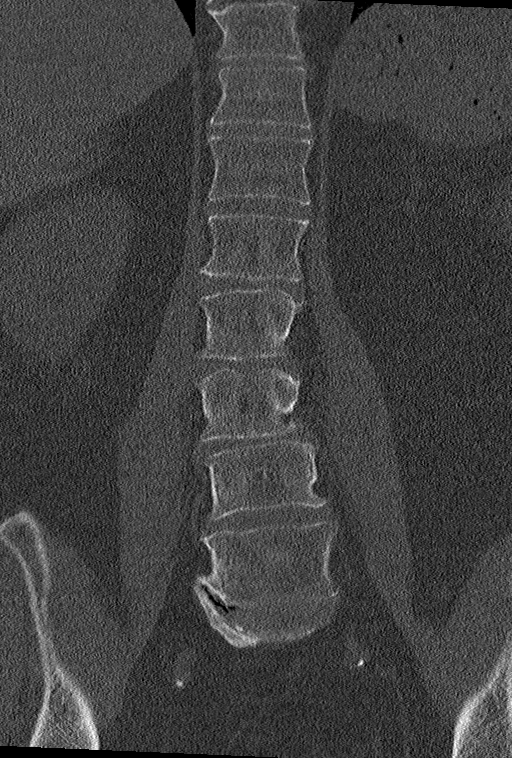
[im 43/71  bone]
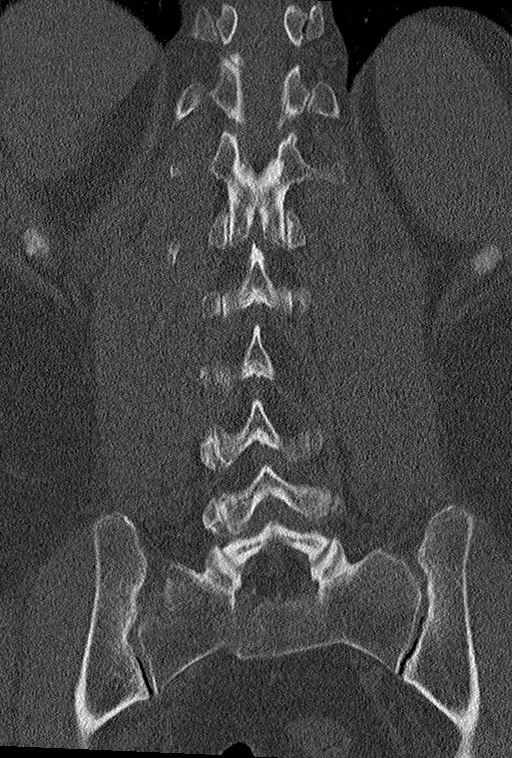

[10 of 33 positions shown; findings below may reference images not displayed]

FINDINGS: Segmentation: There are 6 non rib bearing vertebral bodies. For the
purpose of this report the lowermost vertebra is designated L5.

Alignment: There is no acute subluxation. There is normal lumbar
lordosis.

Vertebrae: There is no acute fracture. The vertebral body heights
are maintained. The bones are osteopenic. There is lesion within the
L3 vertebral body with lucent matrix and coarsened trabecula most
likely representing hemangioma. Other etiologies are less likely but
not excluded MRI may provide better characterisation. Fifty-five
visualized transverse processes as well as the spinous processes are
intact.

Paraspinal and other soft tissues: Negative.

Disc levels: There is advanced disc disease at L5-S1 with endplate
irregularity and disc desiccation.
IMPRESSION: 1. No acute fracture or dislocation.
2. Osteopenia.  Degenerative changes and disease and L5-S1.
3. Mixed density lesion within the L3 most consistent with a
hemangioma. MRI may provide better evaluation.

## 2018-10-19 NOTE — Progress Notes (Deleted)
Subjective:   Christina Ayala is a 67 y.o. female who presents for an Initial Medicare Annual Wellness Visit.  Review of Systems    No ROS.  Medicare Wellness Visit. Additional risk factors are reflected in the social history.     Sleep patterns:   Home Safety/Smoke Alarms: Feels safe in home. Smoke alarms in place.  Living environment;  Seat Belt Safety/Bike Helmet: Wears seat belt.   Female:   Pap-  Aged out     Mammo-       Dexa scan-        CCS-      Objective:    There were no vitals filed for this visit. There is no height or weight on file to calculate BMI.  Advanced Directives 12/02/2017 02/01/2017 09/13/2015  Does Patient Have a Medical Advance Directive? No No No  Would patient like information on creating a medical advance directive? No - Patient declined No - Patient declined No - patient declined information    Current Medications (verified) Outpatient Encounter Medications as of 10/28/2018  Medication Sig  . Certolizumab Pegol (CIMZIA PREFILLED) 2 X 200 MG/ML KIT Inject 400 mg into the skin every 30 (thirty) days.   . cyclobenzaprine (FLEXERIL) 5 MG tablet Take 1 or 2 every 8 hours as needed for muscle relaxant.  caution: May cause drowsiness  . diclofenac sodium (VOLTAREN) 1 % GEL Apply 4 g topically 4 (four) times daily. To affected joint.  . DULoxetine (CYMBALTA) 30 MG capsule Take 60 mg by mouth daily.   . folic acid (FOLVITE) 1 MG tablet Take 1 mg by mouth daily.   Marland Kitchen HYDROcodone-acetaminophen (NORCO/VICODIN) 5-325 MG tablet Take 0.5 tablets by mouth.  . hydroxychloroquine (PLAQUENIL) 200 MG tablet Take 200 mg by mouth 2 (two) times daily.   Marland Kitchen leflunomide (ARAVA) 20 MG tablet Take 20 mg by mouth daily.  Marland Kitchen levothyroxine (SYNTHROID, LEVOTHROID) 150 MCG tablet Take 1 tablet (150 mcg total) by mouth daily before breakfast.  . methotrexate 50 MG/2ML injection 0.8 cc SQ ONCE a week  . methylPREDNISolone (MEDROL) 2 MG tablet Take 0.5 tablets (1 mg total) by mouth  daily.  . Multiple Vitamins-Minerals (MULTIVITAMIN WITH MINERALS) tablet Take 1 tablet by mouth.  . traMADol (ULTRAM) 50 MG tablet Take 25 mg by mouth every 6 (six) hours as needed for moderate pain.   . traZODone (DESYREL) 100 MG tablet Take 100 mg by mouth at bedtime.   . vitamin B-12 (CYANOCOBALAMIN) 500 MCG tablet Take 500 mcg by mouth daily.    No facility-administered encounter medications on file as of 10/28/2018.     Allergies (verified) Other; Eggs or egg-derived products; Penicillins; Percocet [oxycodone-acetaminophen]; Spinach; Sulfa antibiotics; and Erythromycin base   History: Past Medical History:  Diagnosis Date  . Fibromyalgia   . Rheumatoid arthritis ([place])   . Rheumatoid arthritis ([place])   . Thyroid disease    Past Surgical History:  Procedure Laterality Date  . ABDOMINAL HYSTERECTOMY    . CHOLECYSTECTOMY    . LEFT HEART CATH AND CORONARY ANGIOGRAPHY N/A 12/02/2017   Procedure: LEFT HEART CATH AND CORONARY ANGIOGRAPHY;  Surgeon: Leonie Man, MD;  Location: [facility];  Service: Cardiovascular;  Laterality: N/A;  . REPLACEMENT TOTAL KNEE Right   . TONSILLECTOMY    . TYMPANOSTOMY TUBE PLACEMENT     Family History  Problem Relation Age of Onset  . Depression Brother   . Diabetes Maternal Uncle   . Diabetes Paternal Aunt  Social History   Socioeconomic History  . Marital status: Married    Spouse name: Not on file  . Number of children: Not on file  . Years of education: Not on file  . Highest education level: Not on file  Occupational History  . Not on file  Social Needs  . Financial resource strain: Not on file  . Food insecurity:    Worry: Not on file    Inability: Not on file  . Transportation needs:    Medical: Not on file    Non-medical: Not on file  Tobacco Use  . Smoking status: Never Smoker  . Smokeless tobacco: Never Used  Substance and Sexual Activity  . Alcohol use: Yes    Alcohol/week: 0.0 standard drinks  . Drug use:  No  . Sexual activity: Not Currently  Lifestyle  . Physical activity:    Days per week: Not on file    Minutes per session: Not on file  . Stress: Not on file  Relationships  . Social connections:    Talks on phone: Not on file    Gets together: Not on file    Attends religious service: Not on file    Active member of club or organization: Not on file    Attends meetings of clubs or organizations: Not on file    Relationship status: Not on file  Other Topics Concern  . Not on file  Social History Narrative  . Not on file    Tobacco Counseling Counseling given: Not Answered   Clinical Intake:                        Activities of Daily Living No flowsheet data found.   Immunizations and Health Maintenance Immunization History  Administered Date(s) Administered  . Pneumococcal Conjugate-13 09/03/2016  . Tdap 09/13/2015   Health Maintenance Due  Topic Date Due  . COLONOSCOPY  09/12/2018    Patient Care Team: Gregor Hams, MD as PCP - General (Family Medicine)  Indicate any recent Medical Services you may have received from other than [facility] providers in the past year (date may be approximate).     Assessment:   This is a routine wellness examination for Karesha.Physical assessment deferred to PCP.   Hearing/Vision screen No exam data present  Dietary issues and exercise activities discussed:   Diet  Breakfast: Lunch:  Dinner:       Goals   None    Depression Screen PHQ 2/9 Scores 01/01/2018  PHQ - 2 Score 0    Fall Risk Fall Risk  01/01/2018  Falls in the past year? No    Is the patient's home free of loose throw rugs in walkways, pet beds, electrical cords, etc?   {Blank single:19197::"yes","no"}      Grab bars in the bathroom? {Blank single:19197::"yes","no"}      Handrails on the stairs?   {Blank single:19197::"yes","no"}      Adequate lighting?   {Blank single:19197::"yes","no"}   Cognitive Function:        Screening  Tests Health Maintenance  Topic Date Due  . COLONOSCOPY  09/12/2018  . MAMMOGRAM  11/11/2048 (Originally 04/21/2001)  . TETANUS/TDAP  09/12/2025  . DEXA SCAN  Completed  . Hepatitis C Screening  Completed      Plan:   ***  I have personally reviewed and noted the following in the patient's chart:   . Medical and social history . Use of alcohol, tobacco or illicit  drugs  . Current medications and supplements . Functional ability and status . Nutritional status . Physical activity . Advanced directives . List of other physicians . Hospitalizations, surgeries, and ER visits in previous 12 months . Vitals . Screenings to include cognitive, depression, and falls . Referrals and appointments  In addition, I have reviewed and discussed with patient certain preventive protocols, quality metrics, and best practice recommendations. A written personalized care plan for preventive services as well as general preventive health recommendations were provided to patient.     Joanne Chars, LPN   83/02/1961

## 2018-10-27 DIAGNOSIS — M0609 Rheumatoid arthritis without rheumatoid factor, multiple sites: Secondary | ICD-10-CM | POA: Diagnosis not present

## 2018-10-28 ENCOUNTER — Ambulatory Visit: Payer: Medicare Other

## 2018-11-05 ENCOUNTER — Other Ambulatory Visit: Payer: Self-pay | Admitting: Family Medicine

## 2018-11-27 DIAGNOSIS — M0609 Rheumatoid arthritis without rheumatoid factor, multiple sites: Secondary | ICD-10-CM | POA: Diagnosis not present

## 2018-11-27 DIAGNOSIS — Z79899 Other long term (current) drug therapy: Secondary | ICD-10-CM | POA: Diagnosis not present

## 2018-12-08 DIAGNOSIS — M797 Fibromyalgia: Secondary | ICD-10-CM | POA: Diagnosis not present

## 2018-12-08 DIAGNOSIS — M150 Primary generalized (osteo)arthritis: Secondary | ICD-10-CM | POA: Diagnosis not present

## 2018-12-08 DIAGNOSIS — M8580 Other specified disorders of bone density and structure, unspecified site: Secondary | ICD-10-CM | POA: Diagnosis not present

## 2018-12-08 DIAGNOSIS — M0609 Rheumatoid arthritis without rheumatoid factor, multiple sites: Secondary | ICD-10-CM | POA: Diagnosis not present

## 2018-12-08 DIAGNOSIS — Z6841 Body Mass Index (BMI) 40.0 and over, adult: Secondary | ICD-10-CM | POA: Diagnosis not present

## 2018-12-08 DIAGNOSIS — E559 Vitamin D deficiency, unspecified: Secondary | ICD-10-CM | POA: Diagnosis not present

## 2019-01-01 DIAGNOSIS — M0609 Rheumatoid arthritis without rheumatoid factor, multiple sites: Secondary | ICD-10-CM | POA: Diagnosis not present

## 2019-01-29 DIAGNOSIS — M0609 Rheumatoid arthritis without rheumatoid factor, multiple sites: Secondary | ICD-10-CM | POA: Diagnosis not present

## 2019-03-02 DIAGNOSIS — Z79899 Other long term (current) drug therapy: Secondary | ICD-10-CM | POA: Diagnosis not present

## 2019-03-02 DIAGNOSIS — M0609 Rheumatoid arthritis without rheumatoid factor, multiple sites: Secondary | ICD-10-CM | POA: Diagnosis not present

## 2019-03-10 ENCOUNTER — Telehealth: Payer: Self-pay | Admitting: Family Medicine

## 2019-03-10 NOTE — Telephone Encounter (Signed)
Called pt and she states that due to her RA, she is not able to have any vaccines. I have declined pneumonia vaccine in her chart.   She has not been seen in a year and is due for labs and refills- I have scheduled her for a MyChart visit Monday  FYI to Dr Denyse Amass

## 2019-03-10 NOTE — Telephone Encounter (Signed)
I noticed that you appear to be due for your pneumonia vaccine.  That is something we can do during a nurse visit where you effectively drive up to the front of the office and 1 of my nurses gives you the vaccine without having to leave your car.  I would recommend that.  Please call [phone] and schedule a nurse visit for Pneumovax 23 vaccine for pneumonia.

## 2019-03-15 ENCOUNTER — Ambulatory Visit: Payer: Medicare Other | Admitting: Family Medicine

## 2019-04-01 DIAGNOSIS — M0609 Rheumatoid arthritis without rheumatoid factor, multiple sites: Secondary | ICD-10-CM | POA: Diagnosis not present

## 2019-04-09 DIAGNOSIS — Z1159 Encounter for screening for other viral diseases: Secondary | ICD-10-CM | POA: Diagnosis not present

## 2019-04-20 ENCOUNTER — Telehealth: Payer: Self-pay | Admitting: Family Medicine

## 2019-04-20 NOTE — Telephone Encounter (Signed)
Patient states that she has been healthy and that's why she hasnt needed to come in. States that the only think she would need would be thyroid medication but she has a bunch right now. Will call back when she needs an appointment. No further questions at this time.

## 2019-04-20 NOTE — Telephone Encounter (Signed)
Sent to scheduling

## 2019-04-20 NOTE — Telephone Encounter (Signed)
I was doing some review and I noticed it has been well over a year since we saw each of the last.  I think we should schedule a video visit physical or checkup visit reassess your health and get labs or other tests if needed.  Please schedule at [phone]

## 2019-04-26 ENCOUNTER — Other Ambulatory Visit: Payer: Self-pay | Admitting: Family Medicine

## 2019-04-28 ENCOUNTER — Other Ambulatory Visit: Payer: Self-pay | Admitting: Family Medicine

## 2019-04-29 DIAGNOSIS — M0609 Rheumatoid arthritis without rheumatoid factor, multiple sites: Secondary | ICD-10-CM | POA: Diagnosis not present

## 2019-05-04 DIAGNOSIS — M8580 Other specified disorders of bone density and structure, unspecified site: Secondary | ICD-10-CM | POA: Diagnosis not present

## 2019-05-04 DIAGNOSIS — Z6841 Body Mass Index (BMI) 40.0 and over, adult: Secondary | ICD-10-CM | POA: Diagnosis not present

## 2019-05-04 DIAGNOSIS — M797 Fibromyalgia: Secondary | ICD-10-CM | POA: Diagnosis not present

## 2019-05-04 DIAGNOSIS — M0609 Rheumatoid arthritis without rheumatoid factor, multiple sites: Secondary | ICD-10-CM | POA: Diagnosis not present

## 2019-05-04 DIAGNOSIS — M150 Primary generalized (osteo)arthritis: Secondary | ICD-10-CM | POA: Diagnosis not present

## 2019-05-04 DIAGNOSIS — E559 Vitamin D deficiency, unspecified: Secondary | ICD-10-CM | POA: Diagnosis not present

## 2019-05-26 ENCOUNTER — Other Ambulatory Visit: Payer: Self-pay | Admitting: Family Medicine

## 2019-05-27 DIAGNOSIS — M0609 Rheumatoid arthritis without rheumatoid factor, multiple sites: Secondary | ICD-10-CM | POA: Diagnosis not present

## 2019-06-08 ENCOUNTER — Ambulatory Visit (INDEPENDENT_AMBULATORY_CARE_PROVIDER_SITE_OTHER): Payer: Medicare Other | Admitting: Family Medicine

## 2019-06-08 ENCOUNTER — Other Ambulatory Visit: Payer: Self-pay

## 2019-06-08 ENCOUNTER — Encounter: Payer: Self-pay | Admitting: Family Medicine

## 2019-06-08 VITALS — BP 163/93 | HR 98 | Ht 64.0 in | Wt 236.0 lb

## 2019-06-08 DIAGNOSIS — D8489 Other immunodeficiencies: Secondary | ICD-10-CM | POA: Insufficient documentation

## 2019-06-08 DIAGNOSIS — I700 Atherosclerosis of aorta: Secondary | ICD-10-CM

## 2019-06-08 DIAGNOSIS — R9431 Abnormal electrocardiogram [ECG] [EKG]: Secondary | ICD-10-CM

## 2019-06-08 DIAGNOSIS — E039 Hypothyroidism, unspecified: Secondary | ICD-10-CM | POA: Diagnosis not present

## 2019-06-08 DIAGNOSIS — D848 Other specified immunodeficiencies: Secondary | ICD-10-CM | POA: Diagnosis not present

## 2019-06-08 DIAGNOSIS — M069 Rheumatoid arthritis, unspecified: Secondary | ICD-10-CM | POA: Diagnosis not present

## 2019-06-08 DIAGNOSIS — Z79899 Other long term (current) drug therapy: Secondary | ICD-10-CM

## 2019-06-08 DIAGNOSIS — H1132 Conjunctival hemorrhage, left eye: Secondary | ICD-10-CM | POA: Diagnosis not present

## 2019-06-08 NOTE — Patient Instructions (Addendum)
Thank you for coming in today. If all is well recheck yearly or sooner if needed.  Get labs today.  I will request labs from Dr. Melissa Noon office.   Keep track of blood pressure. If most of the time the blood pressure is higher than 130s we should do something about it.   Keep me updated.  Recheck sooner if needed.

## 2019-06-08 NOTE — Progress Notes (Signed)
Christina Ayala is a 68 y.o. female who presents to [place] [place] today for follow-up hypothyroidism, obesity/blood pressure, discuss left subconjunctival hemorrhage, and discuss skin lesions on trunk.  Patient has a history of hypothyroidism.  She typically takes levothyroxine as listed below.  She feels fine without any significant changes to her health recently.  It is been a while since her last labs.  She has a history of rheumatoid arthritis which is managed with rheumatology.  She goes to [facility] and sees Dr. Amil Amen.  She typically gets labs at that office.  She awoke yesterday morning with redness surrounding her left sclera.  She cannot recall any injury and denies any pain or vision change or blurry vision.  She feels fine.  She cannot recall coughing or injury to explain it.  She denies any bleeding or easy bruising.  Additionally she notes that her weight has stayed about the same she thinks.  She tries eat a careful diet but notes it is hard to lose weight with her rheumatoid arthritis.  She notes her blood pressures typically much better controlled usually in the 464V systolic.  Additionally she notes some skin lesions on her trunk.  They are slightly raised and hyperpigmented.  Did not bother her very much but she wants to get them checked out.   ROS as above:  Exam:  BP (!) 163/93   Pulse 98   Ht 5' 4"  (1.626 m)   Wt 236 lb (107 kg)   BMI 40.51 kg/m  Wt Readings from Last 5 Encounters:  06/08/19 236 lb (107 kg)  01/28/18 231 lb (104.8 kg)  01/01/18 230 lb (104.3 kg)  12/29/17 230 lb (104.3 kg)  12/02/17 225 lb (102.1 kg)    Gen: Well NAD HEENT: ,  MMM significant left eye subconjunctival hematoma.  Normal pupillary reflex bilaterally.  Normal eye motion bilaterally. Lungs: Normal work of breathing. CTABL Heart: RRR no MRG  Abd: NABS, Soft. Nondistended, Nontender Exts: Brisk capillary refill, warm and well perfused.  Skin: Several scattered raised hyperpigmented skin lesions consistent appearance with seborrheic keratosis present  Lab and Radiology Results Lab Results  Component Value Date   TSH 1.46 01/01/2018      Assessment and Plan: 68 y.o. female with  Hypothyroidism: Clinically doing well however well overdue for TSH.  Plan to check TSH today and refill levothyroxine when labs are back.  Obesity/hypertension: Blood pressure a bit elevated today and likely reflective of underlying obesity.  Typically better controlled per patient report.  Will request records from rheumatology for both labs as well as office visits where she should have some documentation on her blood pressure more regularly.  If persistently elevated will start antihypertensives.  Encourage patient also for home blood pressure log.  Will check direct LDL today as well.  Subconjunctival hemorrhage: Clinically asymptomatic but still significantly present and unclear fundamental underlying cause.  Will check CBC and INR for coagulopathy.  Skin lesions: Seborrheic keratosis.  Watchful waiting.  PDMP not reviewed this encounter. Orders Placed This Encounter  Procedures  . TSH  . LDL cholesterol, direct  . CBC  . INR/PT   No orders of the defined types were placed in this encounter.    Historical information moved to improve visibility of documentation.  Past Medical History:  Diagnosis Date  . Fibromyalgia   . Rheumatoid arthritis ([place])   . Rheumatoid arthritis ([place])   . Thyroid disease  Past Surgical History:  Procedure Laterality Date  . ABDOMINAL HYSTERECTOMY    . CHOLECYSTECTOMY    . LEFT HEART CATH AND CORONARY ANGIOGRAPHY N/A 12/02/2017   Procedure: LEFT HEART CATH AND CORONARY ANGIOGRAPHY;  Surgeon: Leonie Man, MD;  Location: [place];  Service: Cardiovascular;  Laterality: N/A;  . REPLACEMENT  TOTAL KNEE Right   . TONSILLECTOMY    . TYMPANOSTOMY TUBE PLACEMENT     Social History   Tobacco Use  . Smoking status: Never Smoker  . Smokeless tobacco: Never Used  Substance Use Topics  . Alcohol use: Yes    Alcohol/week: 0.0 standard drinks   family history includes Depression in her brother; Diabetes in her maternal uncle and paternal aunt.  Medications: Current Outpatient Medications  Medication Sig Dispense Refill  . cyclobenzaprine (FLEXERIL) 5 MG tablet Take 1 or 2 every 8 hours as needed for muscle relaxant.  caution: May cause drowsiness 30 tablet 0  . diclofenac sodium (VOLTAREN) 1 % GEL Apply 4 g topically 4 (four) times daily. To affected joint. 100 g 11  . DULoxetine (CYMBALTA) 30 MG capsule Take 60 mg by mouth daily.     . folic acid (FOLVITE) 1 MG tablet Take 1 mg by mouth daily.     Marland Kitchen HYDROcodone-acetaminophen (NORCO/VICODIN) 5-325 MG tablet Take 0.5 tablets by mouth.    . hydroxychloroquine (PLAQUENIL) 200 MG tablet Take 200 mg by mouth 2 (two) times daily.     Marland Kitchen leflunomide (ARAVA) 20 MG tablet Take 20 mg by mouth daily.    Marland Kitchen levothyroxine (SYNTHROID) 150 MCG tablet Take 1 tablet (150 mcg total) by mouth daily before breakfast. Due for labs 15 tablet 0  . methylPREDNISolone (MEDROL) 2 MG tablet Take 0.5 tablets (1 mg total) by mouth daily. 45 tablet 0  . Multiple Vitamins-Minerals (MULTIVITAMIN WITH MINERALS) tablet Take 1 tablet by mouth.    . traMADol (ULTRAM) 50 MG tablet Take 25 mg by mouth every 6 (six) hours as needed for moderate pain.     . traZODone (DESYREL) 100 MG tablet Take 100 mg by mouth at bedtime.     . vitamin B-12 (CYANOCOBALAMIN) 500 MCG tablet Take 500 mcg by mouth daily.     . Certolizumab Pegol (CIMZIA PREFILLED) 2 X 200 MG/ML KIT Inject 400 mg into the skin every 30 (thirty) days.      No current facility-administered medications for this visit.    Allergies  Allergen Reactions  . Other Anaphylaxis    portabella and cremini mushrooms.   Peppers (Any kind)  . Eggs Or Egg-Derived Products   . Penicillins Nausea And Vomiting    Can take keflex  . Percocet [Oxycodone-Acetaminophen] Other (See Comments)    Mood changes, vision changes.   Marland Kitchen Spinach   . Sulfa Antibiotics Nausea And Vomiting  . Erythromycin Base Nausea Only     Discussed warning signs or symptoms. Please see discharge instructions. Patient expresses understanding.

## 2019-06-09 LAB — CBC
HCT: 35.9 % (ref 35.0–45.0)
Hemoglobin: 12.4 g/dL (ref 11.7–15.5)
MCH: 33.2 pg — ABNORMAL HIGH (ref 27.0–33.0)
MCHC: 34.5 g/dL (ref 32.0–36.0)
MCV: 96.2 fL (ref 80.0–100.0)
MPV: 12.5 fL (ref 7.5–12.5)
Platelets: 164 10*3/uL (ref 140–400)
RBC: 3.73 10*6/uL — ABNORMAL LOW (ref 3.80–5.10)
RDW: 11.9 % (ref 11.0–15.0)
WBC: 6.9 10*3/uL (ref 3.8–10.8)

## 2019-06-09 LAB — PROTIME-INR
INR: 1
Prothrombin Time: 10.2 s (ref 9.0–11.5)

## 2019-06-09 LAB — LDL CHOLESTEROL, DIRECT: Direct LDL: 139 mg/dL — ABNORMAL HIGH (ref <100)

## 2019-06-09 LAB — TSH: TSH: 2.49 mIU/L (ref 0.40–4.50)

## 2019-06-09 MED ORDER — LEVOTHYROXINE SODIUM 150 MCG PO TABS: 150.0000 ug | ORAL_TABLET | Freq: Every day | ORAL | 3 refills | Status: DC

## 2019-06-09 NOTE — Addendum Note (Signed)
Addended by: Gregor Hams on: 06/09/2019 05:02 AM   Modules accepted: Orders

## 2019-06-10 ENCOUNTER — Other Ambulatory Visit: Payer: Self-pay

## 2019-06-24 DIAGNOSIS — M0609 Rheumatoid arthritis without rheumatoid factor, multiple sites: Secondary | ICD-10-CM | POA: Diagnosis not present

## 2019-07-22 DIAGNOSIS — M0609 Rheumatoid arthritis without rheumatoid factor, multiple sites: Secondary | ICD-10-CM | POA: Diagnosis not present

## 2019-08-04 DIAGNOSIS — M797 Fibromyalgia: Secondary | ICD-10-CM | POA: Diagnosis not present

## 2019-08-04 DIAGNOSIS — E559 Vitamin D deficiency, unspecified: Secondary | ICD-10-CM | POA: Diagnosis not present

## 2019-08-04 DIAGNOSIS — M8580 Other specified disorders of bone density and structure, unspecified site: Secondary | ICD-10-CM | POA: Diagnosis not present

## 2019-08-04 DIAGNOSIS — M0609 Rheumatoid arthritis without rheumatoid factor, multiple sites: Secondary | ICD-10-CM | POA: Diagnosis not present

## 2019-08-04 DIAGNOSIS — M150 Primary generalized (osteo)arthritis: Secondary | ICD-10-CM | POA: Diagnosis not present

## 2019-08-04 DIAGNOSIS — Z6841 Body Mass Index (BMI) 40.0 and over, adult: Secondary | ICD-10-CM | POA: Diagnosis not present

## 2019-08-19 DIAGNOSIS — M0609 Rheumatoid arthritis without rheumatoid factor, multiple sites: Secondary | ICD-10-CM | POA: Diagnosis not present

## 2019-09-16 DIAGNOSIS — M0609 Rheumatoid arthritis without rheumatoid factor, multiple sites: Secondary | ICD-10-CM | POA: Diagnosis not present

## 2019-10-13 DIAGNOSIS — H5213 Myopia, bilateral: Secondary | ICD-10-CM | POA: Diagnosis not present

## 2019-10-13 DIAGNOSIS — H524 Presbyopia: Secondary | ICD-10-CM | POA: Diagnosis not present

## 2019-10-13 DIAGNOSIS — H2513 Age-related nuclear cataract, bilateral: Secondary | ICD-10-CM | POA: Diagnosis not present

## 2019-10-15 DIAGNOSIS — M0609 Rheumatoid arthritis without rheumatoid factor, multiple sites: Secondary | ICD-10-CM | POA: Diagnosis not present

## 2019-11-19 DIAGNOSIS — M0609 Rheumatoid arthritis without rheumatoid factor, multiple sites: Secondary | ICD-10-CM | POA: Diagnosis not present

## 2019-12-20 DIAGNOSIS — H40003 Preglaucoma, unspecified, bilateral: Secondary | ICD-10-CM | POA: Diagnosis not present

## 2019-12-20 DIAGNOSIS — H18513 Endothelial corneal dystrophy, bilateral: Secondary | ICD-10-CM | POA: Diagnosis not present

## 2019-12-20 DIAGNOSIS — H52203 Unspecified astigmatism, bilateral: Secondary | ICD-10-CM | POA: Diagnosis not present

## 2019-12-20 DIAGNOSIS — H43811 Vitreous degeneration, right eye: Secondary | ICD-10-CM | POA: Diagnosis not present

## 2019-12-20 DIAGNOSIS — H25813 Combined forms of age-related cataract, bilateral: Secondary | ICD-10-CM | POA: Diagnosis not present

## 2019-12-20 DIAGNOSIS — H527 Unspecified disorder of refraction: Secondary | ICD-10-CM | POA: Diagnosis not present

## 2019-12-20 DIAGNOSIS — H18452 Nodular corneal degeneration, left eye: Secondary | ICD-10-CM | POA: Diagnosis not present

## 2019-12-23 DIAGNOSIS — M0609 Rheumatoid arthritis without rheumatoid factor, multiple sites: Secondary | ICD-10-CM | POA: Diagnosis not present

## 2020-01-01 DIAGNOSIS — Z01818 Encounter for other preprocedural examination: Secondary | ICD-10-CM | POA: Diagnosis not present

## 2020-01-04 DIAGNOSIS — E039 Hypothyroidism, unspecified: Secondary | ICD-10-CM | POA: Diagnosis not present

## 2020-01-04 DIAGNOSIS — H25811 Combined forms of age-related cataract, right eye: Secondary | ICD-10-CM | POA: Insufficient documentation

## 2020-01-04 DIAGNOSIS — E669 Obesity, unspecified: Secondary | ICD-10-CM | POA: Diagnosis not present

## 2020-01-04 DIAGNOSIS — M069 Rheumatoid arthritis, unspecified: Secondary | ICD-10-CM | POA: Diagnosis not present

## 2020-01-04 DIAGNOSIS — Z79899 Other long term (current) drug therapy: Secondary | ICD-10-CM | POA: Diagnosis not present

## 2020-01-04 DIAGNOSIS — Z96651 Presence of right artificial knee joint: Secondary | ICD-10-CM | POA: Diagnosis not present

## 2020-01-04 DIAGNOSIS — H52203 Unspecified astigmatism, bilateral: Secondary | ICD-10-CM | POA: Diagnosis not present

## 2020-01-04 DIAGNOSIS — Z880 Allergy status to penicillin: Secondary | ICD-10-CM | POA: Diagnosis not present

## 2020-01-04 DIAGNOSIS — Z881 Allergy status to other antibiotic agents status: Secondary | ICD-10-CM | POA: Diagnosis not present

## 2020-01-04 DIAGNOSIS — Z6839 Body mass index (BMI) 39.0-39.9, adult: Secondary | ICD-10-CM | POA: Diagnosis not present

## 2020-01-05 DIAGNOSIS — H18452 Nodular corneal degeneration, left eye: Secondary | ICD-10-CM | POA: Diagnosis not present

## 2020-01-05 DIAGNOSIS — H527 Unspecified disorder of refraction: Secondary | ICD-10-CM | POA: Diagnosis not present

## 2020-01-05 DIAGNOSIS — H25812 Combined forms of age-related cataract, left eye: Secondary | ICD-10-CM | POA: Diagnosis not present

## 2020-01-05 DIAGNOSIS — H40003 Preglaucoma, unspecified, bilateral: Secondary | ICD-10-CM | POA: Diagnosis not present

## 2020-01-05 DIAGNOSIS — H43811 Vitreous degeneration, right eye: Secondary | ICD-10-CM | POA: Diagnosis not present

## 2020-01-05 DIAGNOSIS — Z961 Presence of intraocular lens: Secondary | ICD-10-CM | POA: Diagnosis not present

## 2020-01-05 DIAGNOSIS — H52203 Unspecified astigmatism, bilateral: Secondary | ICD-10-CM | POA: Diagnosis not present

## 2020-01-05 DIAGNOSIS — H18513 Endothelial corneal dystrophy, bilateral: Secondary | ICD-10-CM | POA: Diagnosis not present

## 2020-01-11 DIAGNOSIS — H52222 Regular astigmatism, left eye: Secondary | ICD-10-CM | POA: Insufficient documentation

## 2020-01-11 DIAGNOSIS — Z79890 Hormone replacement therapy (postmenopausal): Secondary | ICD-10-CM | POA: Diagnosis not present

## 2020-01-11 DIAGNOSIS — H43811 Vitreous degeneration, right eye: Secondary | ICD-10-CM | POA: Diagnosis not present

## 2020-01-11 DIAGNOSIS — Z7952 Long term (current) use of systemic steroids: Secondary | ICD-10-CM | POA: Diagnosis not present

## 2020-01-11 DIAGNOSIS — H40003 Preglaucoma, unspecified, bilateral: Secondary | ICD-10-CM | POA: Diagnosis not present

## 2020-01-11 DIAGNOSIS — M797 Fibromyalgia: Secondary | ICD-10-CM | POA: Diagnosis not present

## 2020-01-11 DIAGNOSIS — H18513 Endothelial corneal dystrophy, bilateral: Secondary | ICD-10-CM | POA: Diagnosis not present

## 2020-01-11 DIAGNOSIS — Z96651 Presence of right artificial knee joint: Secondary | ICD-10-CM | POA: Diagnosis not present

## 2020-01-11 DIAGNOSIS — E039 Hypothyroidism, unspecified: Secondary | ICD-10-CM | POA: Diagnosis not present

## 2020-01-11 DIAGNOSIS — Z6839 Body mass index (BMI) 39.0-39.9, adult: Secondary | ICD-10-CM | POA: Diagnosis not present

## 2020-01-11 DIAGNOSIS — E669 Obesity, unspecified: Secondary | ICD-10-CM | POA: Diagnosis not present

## 2020-01-11 DIAGNOSIS — H527 Unspecified disorder of refraction: Secondary | ICD-10-CM | POA: Diagnosis not present

## 2020-01-11 DIAGNOSIS — H52223 Regular astigmatism, bilateral: Secondary | ICD-10-CM | POA: Diagnosis not present

## 2020-01-11 DIAGNOSIS — H25812 Combined forms of age-related cataract, left eye: Secondary | ICD-10-CM | POA: Diagnosis not present

## 2020-01-11 DIAGNOSIS — H18452 Nodular corneal degeneration, left eye: Secondary | ICD-10-CM | POA: Diagnosis not present

## 2020-01-11 DIAGNOSIS — M059 Rheumatoid arthritis with rheumatoid factor, unspecified: Secondary | ICD-10-CM | POA: Diagnosis not present

## 2020-01-11 DIAGNOSIS — H25813 Combined forms of age-related cataract, bilateral: Secondary | ICD-10-CM | POA: Diagnosis not present

## 2020-01-11 DIAGNOSIS — M069 Rheumatoid arthritis, unspecified: Secondary | ICD-10-CM | POA: Diagnosis not present

## 2020-01-12 DIAGNOSIS — H52201 Unspecified astigmatism, right eye: Secondary | ICD-10-CM | POA: Diagnosis not present

## 2020-01-12 DIAGNOSIS — H40003 Preglaucoma, unspecified, bilateral: Secondary | ICD-10-CM | POA: Diagnosis not present

## 2020-01-12 DIAGNOSIS — Z961 Presence of intraocular lens: Secondary | ICD-10-CM | POA: Diagnosis not present

## 2020-01-12 DIAGNOSIS — H527 Unspecified disorder of refraction: Secondary | ICD-10-CM | POA: Diagnosis not present

## 2020-01-12 DIAGNOSIS — H43811 Vitreous degeneration, right eye: Secondary | ICD-10-CM | POA: Diagnosis not present

## 2020-01-12 DIAGNOSIS — H18452 Nodular corneal degeneration, left eye: Secondary | ICD-10-CM | POA: Diagnosis not present

## 2020-01-12 DIAGNOSIS — H18513 Endothelial corneal dystrophy, bilateral: Secondary | ICD-10-CM | POA: Diagnosis not present

## 2020-01-20 DIAGNOSIS — M0609 Rheumatoid arthritis without rheumatoid factor, multiple sites: Secondary | ICD-10-CM | POA: Diagnosis not present

## 2020-02-02 ENCOUNTER — Ambulatory Visit (INDEPENDENT_AMBULATORY_CARE_PROVIDER_SITE_OTHER): Payer: Medicare Other | Admitting: Family Medicine

## 2020-02-02 ENCOUNTER — Encounter: Payer: Self-pay | Admitting: Family Medicine

## 2020-02-02 ENCOUNTER — Other Ambulatory Visit: Payer: Self-pay

## 2020-02-02 VITALS — BP 113/78 | HR 101 | Temp 97.8°F | Ht 64.0 in | Wt 233.0 lb

## 2020-02-02 DIAGNOSIS — E039 Hypothyroidism, unspecified: Secondary | ICD-10-CM

## 2020-02-02 DIAGNOSIS — M069 Rheumatoid arthritis, unspecified: Secondary | ICD-10-CM | POA: Diagnosis not present

## 2020-02-02 DIAGNOSIS — I700 Atherosclerosis of aorta: Secondary | ICD-10-CM

## 2020-02-02 NOTE — Assessment & Plan Note (Signed)
Update LDL levels. If elevated she may benefit from statin.

## 2020-02-02 NOTE — Patient Instructions (Signed)
Great to meet you today! We'll be in touch with labs results.  Follow up with me in about 6 months or sooner if needed.

## 2020-02-02 NOTE — Progress Notes (Signed)
Christina Ayala - 69 y.o. female MRN [id number]  Date of birth: 29-Oct-1951  Subjective Chief Complaint  Patient presents with  . Follow-up    HPI Christina Ayala is a 69 y.o. female with history of RA, hypothyroidism, and insomnia here today for follow up visit.  She reports that she is doing well.  She denies new concerns today.  She continues to follow with Dr. Dierdre Forth for her RA and is maintained on plaquenil, leflunomide and cimzia.  She does also have active rx for norco that she takes as needed for chronic pain related to her RA.   Recently had b/l cataract surgery.  She is doing well from this.    She has been on stable does of levothyroxine and denies symptoms related to hypo/hyper-thyroidism.  ROS:  A comprehensive ROS was completed and negative except as noted per HPI  Allergies  Allergen Reactions  . Other Anaphylaxis    portabella and cremini mushrooms.  Peppers (Any kind)  . Percocet [Oxycodone-Acetaminophen] Other (See Comments)    Mood changes, vision changes.   . Eggs Or Egg-Derived Products   . Erythromycin Base Nausea Only  . Penicillins Nausea And Vomiting    Can take keflex  . Spinach   . Sulfa Antibiotics Nausea And Vomiting    Past Medical History:  Diagnosis Date  . Fibromyalgia   . Rheumatoid arthritis (HCC)   . Rheumatoid arthritis (HCC)   . Thyroid disease     Past Surgical History:  Procedure Laterality Date  . ABDOMINAL HYSTERECTOMY    . CHOLECYSTECTOMY    . LEFT HEART CATH AND CORONARY ANGIOGRAPHY N/A 12/02/2017   Procedure: LEFT HEART CATH AND CORONARY ANGIOGRAPHY;  Surgeon: Marykay Lex, MD;  Location: [facility] INVASIVE CV LAB;  Service: Cardiovascular;  Laterality: N/A;  . REPLACEMENT TOTAL KNEE Right   . TONSILLECTOMY    . TYMPANOSTOMY TUBE PLACEMENT      Social History   Socioeconomic History  . Marital status: Married    Spouse name: Not on file  . Number of children: Not on file  . Years of education: Not on file  . Highest  education level: Not on file  Occupational History  . Not on file  Tobacco Use  . Smoking status: Never Smoker  . Smokeless tobacco: Never Used  Substance and Sexual Activity  . Alcohol use: Yes    Alcohol/week: 0.0 standard drinks  . Drug use: No  . Sexual activity: Not Currently  Other Topics Concern  . Not on file  Social History Narrative  . Not on file   Social Determinants of Health   Financial Resource Strain:   . Difficulty of Paying Living Expenses:   Food Insecurity:   . Worried About Programme researcher, broadcasting/film/video in the Last Year:   . Barista in the Last Year:   Transportation Needs:   . Freight forwarder (Medical):   Marland Kitchen Lack of Transportation (Non-Medical):   Physical Activity:   . Days of Exercise per Week:   . Minutes of Exercise per Session:   Stress:   . Feeling of Stress :   Social Connections:   . Frequency of Communication with Friends and Family:   . Frequency of Social Gatherings with Friends and Family:   . Attends Religious Services:   . Active Member of Clubs or Organizations:   . Attends Banker Meetings:   Marland Kitchen Marital Status:     Family History  Problem Relation Age  of Onset  . Depression Brother   . Diabetes Maternal Uncle   . Diabetes Paternal Aunt     Health Maintenance  Topic Date Due  . COLONOSCOPY  09/12/2018  . MAMMOGRAM  11/11/2048 (Originally 04/21/2001)  . TETANUS/TDAP  09/12/2025  . DEXA SCAN  Completed  . Hepatitis C Screening  Completed     ----------------------------------------------------------------------------------------------------------------------------------------------------------------------------------------------------------------- Physical Exam BP 113/78   Pulse (!) 101   Temp 97.8 F (36.6 C) (Oral)   Ht 5\' 4"  (1.626 m)   Wt 233 lb (105.7 kg)   BMI 39.99 kg/m   Physical Exam Constitutional:      Appearance: Normal appearance.  HENT:     Head: Atraumatic.  Eyes:     General: No  scleral icterus. Cardiovascular:     Rate and Rhythm: Normal rate and regular rhythm.  Pulmonary:     Effort: Pulmonary effort is normal.     Breath sounds: Normal breath sounds.  Skin:    General: Skin is warm and dry.  Neurological:     General: No focal deficit present.     Mental Status: She is alert.  Psychiatric:        Mood and Affect: Mood normal.        Behavior: Behavior normal.     ------------------------------------------------------------------------------------------------------------------------------------------------------------------------------------------------------------------- Assessment and Plan  Hypothyroidism Stable with current dose of medication.  Update TSH today.    Thoracic aortic atherosclerosis (HCC) Update LDL levels. If elevated she may benefit from statin.   Rheumatoid arthritis ([place]) Stable at this time.  She continues to follow with Dr. Amil Amen He is monitoring frequent labs for liver and kidney function.  She sees eye doctor regularly for monitoring of plaquenil.  Recently had cataract surgery.     No orders of the defined types were placed in this encounter.   Return in about 6 months (around 08/04/2020) for Hypothyroidism.    This visit occurred during the SARS-CoV-2 public health emergency.  Safety protocols were in place, including screening questions prior to the visit, additional usage of staff PPE, and extensive cleaning of exam room while observing appropriate contact time as indicated for disinfecting solutions.

## 2020-02-02 NOTE — Assessment & Plan Note (Signed)
Stable at this time.  She continues to follow with Dr. Dierdre Forth He is monitoring frequent labs for liver and kidney function.  She sees eye doctor regularly for monitoring of plaquenil.  Recently had cataract surgery.

## 2020-02-02 NOTE — Assessment & Plan Note (Signed)
Stable with current dose of medication.  Update TSH today.

## 2020-02-03 ENCOUNTER — Other Ambulatory Visit: Payer: Self-pay | Admitting: Family Medicine

## 2020-02-03 LAB — CBC
HCT: 38.7 % (ref 35.0–45.0)
Hemoglobin: 12.7 g/dL (ref 11.7–15.5)
MCH: 32.6 pg (ref 27.0–33.0)
MCHC: 32.8 g/dL (ref 32.0–36.0)
MCV: 99.2 fL (ref 80.0–100.0)
MPV: 12.6 fL — ABNORMAL HIGH (ref 7.5–12.5)
Platelets: 150 10*3/uL (ref 140–400)
RBC: 3.9 10*6/uL (ref 3.80–5.10)
RDW: 11.8 % (ref 11.0–15.0)
WBC: 6.3 10*3/uL (ref 3.8–10.8)

## 2020-02-03 LAB — LDL CHOLESTEROL, DIRECT: Direct LDL: 103 mg/dL — ABNORMAL HIGH (ref <100)

## 2020-02-03 LAB — TSH: TSH: 5.71 mIU/L — ABNORMAL HIGH (ref 0.40–4.50)

## 2020-02-08 ENCOUNTER — Other Ambulatory Visit: Payer: Self-pay | Admitting: Family Medicine

## 2020-02-08 DIAGNOSIS — E039 Hypothyroidism, unspecified: Secondary | ICD-10-CM

## 2020-02-08 MED ORDER — LEVOTHYROXINE SODIUM 175 MCG PO TABS: 175.0000 ug | ORAL_TABLET | Freq: Every day | ORAL | 1 refills | Status: DC

## 2020-02-08 NOTE — Progress Notes (Signed)
Orders have been placed for repeat blood work for her TSH. I called the patient and she does not have a VM set up to leave a message.

## 2020-02-17 DIAGNOSIS — M0609 Rheumatoid arthritis without rheumatoid factor, multiple sites: Secondary | ICD-10-CM | POA: Diagnosis not present

## 2020-03-08 DIAGNOSIS — M8580 Other specified disorders of bone density and structure, unspecified site: Secondary | ICD-10-CM | POA: Diagnosis not present

## 2020-03-08 DIAGNOSIS — M797 Fibromyalgia: Secondary | ICD-10-CM | POA: Diagnosis not present

## 2020-03-08 DIAGNOSIS — Z6841 Body Mass Index (BMI) 40.0 and over, adult: Secondary | ICD-10-CM | POA: Diagnosis not present

## 2020-03-08 DIAGNOSIS — M150 Primary generalized (osteo)arthritis: Secondary | ICD-10-CM | POA: Diagnosis not present

## 2020-03-08 DIAGNOSIS — M0609 Rheumatoid arthritis without rheumatoid factor, multiple sites: Secondary | ICD-10-CM | POA: Diagnosis not present

## 2020-03-08 DIAGNOSIS — E559 Vitamin D deficiency, unspecified: Secondary | ICD-10-CM | POA: Diagnosis not present

## 2020-03-13 ENCOUNTER — Telehealth (INDEPENDENT_AMBULATORY_CARE_PROVIDER_SITE_OTHER): Payer: Medicare Other | Admitting: Family Medicine

## 2020-03-13 ENCOUNTER — Encounter: Payer: Self-pay | Admitting: Family Medicine

## 2020-03-13 DIAGNOSIS — J0100 Acute maxillary sinusitis, unspecified: Secondary | ICD-10-CM | POA: Insufficient documentation

## 2020-03-13 MED ORDER — CEFDINIR 300 MG PO CAPS: 300.0000 mg | ORAL_CAPSULE | Freq: Two times a day (BID) | ORAL | 0 refills | Status: AC

## 2020-03-13 NOTE — Progress Notes (Signed)
Christina Ayala - 69 y.o. female MRN [id number]  Date of birth: May 19, 1951   This visit type was conducted due to national recommendations for restrictions regarding the COVID-19 Pandemic (e.g. social distancing).  This format is felt to be most appropriate for this patient at this time.  All issues noted in this document were discussed and addressed.  No physical exam was performed (except for noted visual exam findings with Video Visits).  I discussed the limitations of evaluation and management by telemedicine and the availability of in person appointments. The patient expressed understanding and agreed to proceed.  I connected with@ on 03/13/20 at  3:40 PM EDT by a video enabled telemedicine application and verified that I am speaking with the correct person using two identifiers.  Interactive audio and video telecommunications were attempted between this provider and patient, however failed, due to patient having technical difficulties OR patient did not have access to video capability.  We continued and completed visit with audio only.    Present at visit: Christina Nutting, DO Christina Ayala   Patient Location: [place]  [address]   Provider location:   [place] Complaint  Patient presents with  . Sinusitis    HPI  Christina Ayala is a 69 y.o. female who presents via audio/video conferencing for a telehealth visit today.  She reports 1 week history of progressive sinus congestion and pain.  Mucus is thick, yellow/green color.  Denies blood.  She has not had fever, chills, shortness of breath, wheezing, chest tightness, nausea, vomiting, diarrhea.   She had tried ibuprofen which provides limited relief.    ROS:  A comprehensive ROS was completed and negative except as noted per HPI  Past Medical History:  Diagnosis Date  . Fibromyalgia   . Rheumatoid arthritis ([place])   . Rheumatoid arthritis ([place])   . Thyroid disease     Past Surgical History:  Procedure  Laterality Date  . ABDOMINAL HYSTERECTOMY    . CHOLECYSTECTOMY    . LEFT HEART CATH AND CORONARY ANGIOGRAPHY N/A 12/02/2017   Procedure: LEFT HEART CATH AND CORONARY ANGIOGRAPHY;  Surgeon: Leonie Man, MD;  Location: [facility];  Service: Cardiovascular;  Laterality: N/A;  . REPLACEMENT TOTAL KNEE Right   . TONSILLECTOMY    . TYMPANOSTOMY TUBE PLACEMENT      Family History  Problem Relation Age of Onset  . Depression Brother   . Diabetes Maternal Uncle   . Diabetes Paternal Aunt     Social History   Socioeconomic History  . Marital status: Married    Spouse name: Not on file  . Number of children: Not on file  . Years of education: Not on file  . Highest education level: Not on file  Occupational History  . Not on file  Tobacco Use  . Smoking status: Never Smoker  . Smokeless tobacco: Never Used  Substance and Sexual Activity  . Alcohol use: Yes    Alcohol/week: 0.0 standard drinks  . Drug use: No  . Sexual activity: Not Currently  Other Topics Concern  . Not on file  Social History Narrative  . Not on file   Social Determinants of Health   Financial Resource Strain:   . Difficulty of Paying Living Expenses:   Food Insecurity:   . Worried About Charity fundraiser in the Last Year:   . Home Garden in the Last Year:   Transportation Needs:   . Lack of  Transportation (Medical):   Marland Kitchen Lack of Transportation (Non-Medical):   Physical Activity:   . Days of Exercise per Week:   . Minutes of Exercise per Session:   Stress:   . Feeling of Stress :   Social Connections:   . Frequency of Communication with Friends and Family:   . Frequency of Social Gatherings with Friends and Family:   . Attends Religious Services:   . Active Member of Clubs or Organizations:   . Attends Archivist Meetings:   Marland Kitchen Marital Status:   Intimate Partner Violence:   . Fear of Current or Ex-Partner:   . Emotionally Abused:   Marland Kitchen Physically Abused:   . Sexually  Abused:      Current Outpatient Medications:  .  Certolizumab Pegol (CIMZIA PREFILLED) 2 X 200 MG/ML KIT, Inject 400 mg into the skin every 30 (thirty) days. , Disp: , Rfl:  .  cyclobenzaprine (FLEXERIL) 5 MG tablet, Take 1 or 2 every 8 hours as needed for muscle relaxant.  caution: May cause drowsiness, Disp: 30 tablet, Rfl: 0 .  diclofenac sodium (VOLTAREN) 1 % GEL, Apply 4 g topically 4 (four) times daily. To affected joint., Disp: 100 g, Rfl: 11 .  DULoxetine (CYMBALTA) 30 MG capsule, Take 60 mg by mouth daily. , Disp: , Rfl:  .  folic acid (FOLVITE) 1 MG tablet, Take 1 mg by mouth daily. , Disp: , Rfl:  .  HYDROcodone-acetaminophen (NORCO/VICODIN) 5-325 MG tablet, Take 0.5 tablets by mouth., Disp: , Rfl:  .  hydroxychloroquine (PLAQUENIL) 200 MG tablet, Take 200 mg by mouth 2 (two) times daily. , Disp: , Rfl:  .  leflunomide (ARAVA) 20 MG tablet, Take 20 mg by mouth daily., Disp: , Rfl:  .  levothyroxine (SYNTHROID) 175 MCG tablet, Take 1 tablet (175 mcg total) by mouth daily before breakfast., Disp: 90 tablet, Rfl: 1 .  methylPREDNISolone (MEDROL) 2 MG tablet, Take 0.5 tablets (1 mg total) by mouth daily., Disp: 45 tablet, Rfl: 0 .  Multiple Vitamins-Minerals (MULTIVITAMIN WITH MINERALS) tablet, Take 1 tablet by mouth., Disp: , Rfl:  .  traZODone (DESYREL) 100 MG tablet, Take 100 mg by mouth as needed for sleep. , Disp: , Rfl:   EXAM:  VITALS per patient if applicable: Temp (!) 27.7 F (36.3 C) (Oral)   Wt 218 lb (98.9 kg)   BMI 37.42 kg/m   GENERAL: alert, oriented, in no acute distress  PSYCH/NEURO: pleasant and cooperative, no obvious depression or anxiety, speech and thought processing grossly intact  ASSESSMENT AND PLAN:  Discussed the following assessment and plan:  Acute maxillary sinusitis Worsening symptoms consistent with sinusitis.  Allergy to PCN but can tolerate cephalexin.  Will start omnicef 348m BID x10 days.  Recommend supportive care with increased fluids  and rest.  Call for development of new or worsening symptoms.     30 minutes spent including pre visit preparation, review of prior notes and labs, encounter with patient via video visit and same day documentation.  I discussed the assessment and treatment plan with the patient. The patient was provided an opportunity to ask questions and all were answered. The patient agreed with the plan and demonstrated an understanding of the instructions.   The patient was advised to call back or seek an in-person evaluation if the symptoms worsen or if the condition fails to improve as anticipated.    CLuetta Nutting DO

## 2020-03-13 NOTE — Progress Notes (Signed)
Sinus infection x 1 week. Runny nose.  Congestion (almost painful) Occasional cough (clear or slight green phlegm)

## 2020-03-13 NOTE — Assessment & Plan Note (Signed)
Worsening symptoms consistent with sinusitis.  Allergy to PCN but can tolerate cephalexin.  Will start omnicef 300mg  BID x10 days.  Recommend supportive care with increased fluids and rest.  Call for development of new or worsening symptoms.

## 2020-03-16 DIAGNOSIS — M0609 Rheumatoid arthritis without rheumatoid factor, multiple sites: Secondary | ICD-10-CM | POA: Diagnosis not present

## 2020-04-13 DIAGNOSIS — M0609 Rheumatoid arthritis without rheumatoid factor, multiple sites: Secondary | ICD-10-CM | POA: Diagnosis not present

## 2020-06-07 DIAGNOSIS — M0609 Rheumatoid arthritis without rheumatoid factor, multiple sites: Secondary | ICD-10-CM | POA: Diagnosis not present

## 2020-06-07 DIAGNOSIS — E559 Vitamin D deficiency, unspecified: Secondary | ICD-10-CM | POA: Diagnosis not present

## 2020-06-07 DIAGNOSIS — M150 Primary generalized (osteo)arthritis: Secondary | ICD-10-CM | POA: Diagnosis not present

## 2020-06-07 DIAGNOSIS — M797 Fibromyalgia: Secondary | ICD-10-CM | POA: Diagnosis not present

## 2020-06-07 DIAGNOSIS — Z6841 Body Mass Index (BMI) 40.0 and over, adult: Secondary | ICD-10-CM | POA: Diagnosis not present

## 2020-06-07 DIAGNOSIS — M8580 Other specified disorders of bone density and structure, unspecified site: Secondary | ICD-10-CM | POA: Diagnosis not present

## 2020-06-15 DIAGNOSIS — M0609 Rheumatoid arthritis without rheumatoid factor, multiple sites: Secondary | ICD-10-CM | POA: Diagnosis not present

## 2020-07-13 DIAGNOSIS — M0609 Rheumatoid arthritis without rheumatoid factor, multiple sites: Secondary | ICD-10-CM | POA: Diagnosis not present

## 2020-08-02 ENCOUNTER — Other Ambulatory Visit: Payer: Self-pay | Admitting: Family Medicine

## 2020-08-17 DIAGNOSIS — M0609 Rheumatoid arthritis without rheumatoid factor, multiple sites: Secondary | ICD-10-CM | POA: Diagnosis not present

## 2020-09-08 DIAGNOSIS — M150 Primary generalized (osteo)arthritis: Secondary | ICD-10-CM | POA: Diagnosis not present

## 2020-09-08 DIAGNOSIS — E669 Obesity, unspecified: Secondary | ICD-10-CM | POA: Diagnosis not present

## 2020-09-08 DIAGNOSIS — E559 Vitamin D deficiency, unspecified: Secondary | ICD-10-CM | POA: Diagnosis not present

## 2020-09-08 DIAGNOSIS — M0609 Rheumatoid arthritis without rheumatoid factor, multiple sites: Secondary | ICD-10-CM | POA: Diagnosis not present

## 2020-09-08 DIAGNOSIS — M797 Fibromyalgia: Secondary | ICD-10-CM | POA: Diagnosis not present

## 2020-09-08 DIAGNOSIS — Z6839 Body mass index (BMI) 39.0-39.9, adult: Secondary | ICD-10-CM | POA: Diagnosis not present

## 2020-09-08 DIAGNOSIS — M8580 Other specified disorders of bone density and structure, unspecified site: Secondary | ICD-10-CM | POA: Diagnosis not present

## 2020-09-14 DIAGNOSIS — M0609 Rheumatoid arthritis without rheumatoid factor, multiple sites: Secondary | ICD-10-CM | POA: Diagnosis not present

## 2020-09-22 DIAGNOSIS — Z23 Encounter for immunization: Secondary | ICD-10-CM | POA: Diagnosis not present

## 2020-10-04 ENCOUNTER — Other Ambulatory Visit: Payer: Self-pay | Admitting: Family Medicine

## 2020-10-04 NOTE — Telephone Encounter (Signed)
Needs to have TSH rechecked.

## 2020-10-12 DIAGNOSIS — M0609 Rheumatoid arthritis without rheumatoid factor, multiple sites: Secondary | ICD-10-CM | POA: Diagnosis not present

## 2020-11-05 ENCOUNTER — Other Ambulatory Visit: Payer: Self-pay | Admitting: Family Medicine

## 2020-11-09 DIAGNOSIS — M0609 Rheumatoid arthritis without rheumatoid factor, multiple sites: Secondary | ICD-10-CM | POA: Diagnosis not present

## 2020-11-09 DIAGNOSIS — Z111 Encounter for screening for respiratory tuberculosis: Secondary | ICD-10-CM | POA: Diagnosis not present

## 2020-11-09 DIAGNOSIS — R5383 Other fatigue: Secondary | ICD-10-CM | POA: Diagnosis not present

## 2020-12-06 ENCOUNTER — Other Ambulatory Visit: Payer: Self-pay

## 2020-12-06 ENCOUNTER — Encounter: Payer: Self-pay | Admitting: Family Medicine

## 2020-12-06 ENCOUNTER — Ambulatory Visit (INDEPENDENT_AMBULATORY_CARE_PROVIDER_SITE_OTHER): Payer: Medicare Other | Admitting: Family Medicine

## 2020-12-06 VITALS — BP 149/79 | HR 106 | Wt 224.0 lb

## 2020-12-06 DIAGNOSIS — E039 Hypothyroidism, unspecified: Secondary | ICD-10-CM

## 2020-12-06 DIAGNOSIS — Z13220 Encounter for screening for lipoid disorders: Secondary | ICD-10-CM | POA: Diagnosis not present

## 2020-12-06 DIAGNOSIS — M069 Rheumatoid arthritis, unspecified: Secondary | ICD-10-CM

## 2020-12-06 DIAGNOSIS — M8580 Other specified disorders of bone density and structure, unspecified site: Secondary | ICD-10-CM | POA: Diagnosis not present

## 2020-12-06 DIAGNOSIS — Z5320 Procedure and treatment not carried out because of patient's decision for unspecified reasons: Secondary | ICD-10-CM

## 2020-12-06 DIAGNOSIS — M0609 Rheumatoid arthritis without rheumatoid factor, multiple sites: Secondary | ICD-10-CM | POA: Diagnosis not present

## 2020-12-06 DIAGNOSIS — M150 Primary generalized (osteo)arthritis: Secondary | ICD-10-CM | POA: Diagnosis not present

## 2020-12-06 DIAGNOSIS — Z6839 Body mass index (BMI) 39.0-39.9, adult: Secondary | ICD-10-CM | POA: Diagnosis not present

## 2020-12-06 DIAGNOSIS — E669 Obesity, unspecified: Secondary | ICD-10-CM | POA: Diagnosis not present

## 2020-12-06 DIAGNOSIS — M797 Fibromyalgia: Secondary | ICD-10-CM | POA: Diagnosis not present

## 2020-12-06 DIAGNOSIS — E559 Vitamin D deficiency, unspecified: Secondary | ICD-10-CM

## 2020-12-06 NOTE — Patient Instructions (Signed)
Great to see you today! Please have labs completed.  Hope your pain and sleep improves soon.  See me again in about 6 months.

## 2020-12-07 ENCOUNTER — Other Ambulatory Visit: Payer: Self-pay | Admitting: Family Medicine

## 2020-12-07 DIAGNOSIS — E039 Hypothyroidism, unspecified: Secondary | ICD-10-CM

## 2020-12-07 DIAGNOSIS — M0609 Rheumatoid arthritis without rheumatoid factor, multiple sites: Secondary | ICD-10-CM | POA: Diagnosis not present

## 2020-12-07 LAB — LIPID PANEL W/REFLEX DIRECT LDL
Cholesterol: 178 mg/dL (ref <200)
HDL: 47 mg/dL — ABNORMAL LOW (ref >=50)
LDL Cholesterol (Calc): 99 mg/dL (calc)
Non-HDL Cholesterol (Calc): 131 mg/dL (calc) — ABNORMAL HIGH (ref <130)
Total CHOL/HDL Ratio: 3.8 (calc) (ref <5.0)
Triglycerides: 199 mg/dL — ABNORMAL HIGH (ref <150)

## 2020-12-07 LAB — VITAMIN D 25 HYDROXY (VIT D DEFICIENCY, FRACTURES): Vit D, 25-Hydroxy: 61 ng/mL (ref 30–100)

## 2020-12-07 LAB — TSH: TSH: 0.12 mIU/L — ABNORMAL LOW (ref 0.40–4.50)

## 2020-12-07 MED ORDER — LEVOTHYROXINE SODIUM 150 MCG PO TABS
ORAL_TABLET | ORAL | 0 refills | Status: DC
Start: 2020-12-07 — End: 2021-03-02

## 2020-12-10 DIAGNOSIS — Z5320 Procedure and treatment not carried out because of patient's decision for unspecified reasons: Secondary | ICD-10-CM | POA: Insufficient documentation

## 2020-12-10 NOTE — Assessment & Plan Note (Addendum)
Feels good at current dose.  Update TSH

## 2020-12-10 NOTE — Assessment & Plan Note (Signed)
Stable at this time, managed by rheumatology.

## 2020-12-10 NOTE — Progress Notes (Signed)
Christina Ayala - 70 y.o. female MRN [id number]  Date of birth: 1951/03/27  Subjective Chief Complaint  Patient presents with  . Thyroid Problem    HPI Christina Ayala is a 70 y.o. female with history of RA, fibromyalgia, hypothyroid,  And insomnia.   She continues to see Dr Dierdre Forth for tx of RA and FM.  Current treatemtn with cymbalta, plaquenil, leflunomide, medrol 2mg , and cimzia.  She is doing pretty well with current medications. Norco recently increased to full tab as needed.  She has not had significant side effects.  She has had bone density scan.   She feels well with current dose of levothyroxine.  No side effects noted at this time.   She is due for updated labs.   She declines any colon cancer screening.  ROS:  A comprehensive ROS was completed and negative except as noted per HPI    Allergies  Allergen Reactions  . Other Anaphylaxis    portabella and cremini mushrooms.  Peppers (Any kind)  . Percocet [Oxycodone-Acetaminophen] Other (See Comments)    Mood changes, vision changes.   . Eggs Or Egg-Derived Products   . Erythromycin Base Nausea Only  . Penicillins Nausea And Vomiting    Can take keflex  . Spinach   . Sulfa Antibiotics Nausea And Vomiting    Past Medical History:  Diagnosis Date  . Fibromyalgia   . Rheumatoid arthritis (HCC)   . Rheumatoid arthritis (HCC)   . Thyroid disease     Past Surgical History:  Procedure Laterality Date  . ABDOMINAL HYSTERECTOMY    . CHOLECYSTECTOMY    . LEFT HEART CATH AND CORONARY ANGIOGRAPHY N/A 12/02/2017   Procedure: LEFT HEART CATH AND CORONARY ANGIOGRAPHY;  Surgeon: 12/04/2017, MD;  Location: [facility] INVASIVE CV LAB;  Service: Cardiovascular;  Laterality: N/A;  . REPLACEMENT TOTAL KNEE Right   . TONSILLECTOMY    . TYMPANOSTOMY TUBE PLACEMENT      Social History   Socioeconomic History  . Marital status: Married    Spouse name: Not on file  . Number of children: Not on file  . Years of education: Not on  file  . Highest education level: Not on file  Occupational History  . Not on file  Tobacco Use  . Smoking status: Never Smoker  . Smokeless tobacco: Never Used  Substance and Sexual Activity  . Alcohol use: Yes    Alcohol/week: 0.0 standard drinks  . Drug use: No  . Sexual activity: Not Currently  Other Topics Concern  . Not on file  Social History Narrative  . Not on file   Social Determinants of Health   Financial Resource Strain: Not on file  Food Insecurity: Not on file  Transportation Needs: Not on file  Physical Activity: Not on file  Stress: Not on file  Social Connections: Not on file    Family History  Problem Relation Age of Onset  . Depression Brother   . Diabetes Maternal Uncle   . Diabetes Paternal Aunt     Health Maintenance  Topic Date Due  . COVID-19 Vaccine (3 - Moderna risk 4-dose series) 03/21/2020  . COLONOSCOPY (Pts 45-48yrs Insurance coverage will need to be confirmed)  12/07/2021 (Originally 09/12/2018)  . MAMMOGRAM  11/11/2048 (Originally 04/21/2001)  . TETANUS/TDAP  09/12/2025  . DEXA SCAN  Completed  . Hepatitis C Screening  Completed     ----------------------------------------------------------------------------------------------------------------------------------------------------------------------------------------------------------------- Physical Exam BP (!) 149/79 (BP Location: Right Wrist, Patient Position: Sitting, Cuff Size: Small)  Pulse (!) 106   Wt 224 lb (101.6 kg)   SpO2 96%   BMI 38.45 kg/m   Physical Exam Constitutional:      Appearance: Normal appearance.  HENT:     Head: Normocephalic and atraumatic.  Eyes:     General: No scleral icterus. Cardiovascular:     Rate and Rhythm: Normal rate and regular rhythm.  Pulmonary:     Effort: Pulmonary effort is normal.     Breath sounds: Normal breath sounds.  Musculoskeletal:     Cervical back: Neck supple.  Neurological:     General: No focal deficit present.      Mental Status: She is alert.  Psychiatric:        Mood and Affect: Mood normal.        Behavior: Behavior normal.     ------------------------------------------------------------------------------------------------------------------------------------------------------------------------------------------------------------------- Assessment and Plan  Hypothyroidism Feels good at current dose.  Update TSH  Rheumatoid arthritis (HCC) Stable at this time, managed by rheumatology.   Colon cancer screening declined She is aware of risks of not screening for colon cancer.     No orders of the defined types were placed in this encounter.  Orders Placed This Encounter  Procedures  . TSH  . Vitamin D (25 hydroxy)  . Lipid Panel w/reflex Direct LDL    Return in about 6 months (around 06/05/2021) for Hypothyroid.    This visit occurred during the SARS-CoV-2 public health emergency.  Safety protocols were in place, including screening questions prior to the visit, additional usage of staff PPE, and extensive cleaning of exam room while observing appropriate contact time as indicated for disinfecting solutions.

## 2020-12-10 NOTE — Assessment & Plan Note (Signed)
She is aware of risks of not screening for colon cancer.

## 2020-12-25 ENCOUNTER — Telehealth: Payer: Self-pay | Admitting: General Practice

## 2020-12-29 NOTE — Telephone Encounter (Signed)
Documentation only.

## 2021-01-04 DIAGNOSIS — M0609 Rheumatoid arthritis without rheumatoid factor, multiple sites: Secondary | ICD-10-CM | POA: Diagnosis not present

## 2021-02-01 DIAGNOSIS — M0609 Rheumatoid arthritis without rheumatoid factor, multiple sites: Secondary | ICD-10-CM | POA: Diagnosis not present

## 2021-03-01 DIAGNOSIS — M0609 Rheumatoid arthritis without rheumatoid factor, multiple sites: Secondary | ICD-10-CM | POA: Diagnosis not present

## 2021-03-02 ENCOUNTER — Other Ambulatory Visit: Payer: Self-pay | Admitting: Family Medicine

## 2021-03-15 DIAGNOSIS — M0609 Rheumatoid arthritis without rheumatoid factor, multiple sites: Secondary | ICD-10-CM | POA: Diagnosis not present

## 2021-03-15 DIAGNOSIS — Z6839 Body mass index (BMI) 39.0-39.9, adult: Secondary | ICD-10-CM | POA: Diagnosis not present

## 2021-03-15 DIAGNOSIS — M797 Fibromyalgia: Secondary | ICD-10-CM | POA: Diagnosis not present

## 2021-03-15 DIAGNOSIS — M8580 Other specified disorders of bone density and structure, unspecified site: Secondary | ICD-10-CM | POA: Diagnosis not present

## 2021-03-15 DIAGNOSIS — E559 Vitamin D deficiency, unspecified: Secondary | ICD-10-CM | POA: Diagnosis not present

## 2021-03-15 DIAGNOSIS — E669 Obesity, unspecified: Secondary | ICD-10-CM | POA: Diagnosis not present

## 2021-03-15 DIAGNOSIS — M150 Primary generalized (osteo)arthritis: Secondary | ICD-10-CM | POA: Diagnosis not present

## 2021-03-29 DIAGNOSIS — M0609 Rheumatoid arthritis without rheumatoid factor, multiple sites: Secondary | ICD-10-CM | POA: Diagnosis not present

## 2021-04-10 ENCOUNTER — Other Ambulatory Visit: Payer: Self-pay | Admitting: Family Medicine

## 2021-04-16 ENCOUNTER — Other Ambulatory Visit: Payer: Self-pay

## 2021-04-16 DIAGNOSIS — E039 Hypothyroidism, unspecified: Secondary | ICD-10-CM

## 2021-04-17 DIAGNOSIS — Z23 Encounter for immunization: Secondary | ICD-10-CM | POA: Diagnosis not present

## 2021-04-17 DIAGNOSIS — E039 Hypothyroidism, unspecified: Secondary | ICD-10-CM | POA: Diagnosis not present

## 2021-04-18 LAB — TSH: TSH: 0.19 mIU/L — ABNORMAL LOW (ref 0.40–4.50)

## 2021-04-19 ENCOUNTER — Other Ambulatory Visit: Payer: Self-pay | Admitting: Family Medicine

## 2021-04-19 ENCOUNTER — Telehealth: Payer: Self-pay | Admitting: Family Medicine

## 2021-04-19 DIAGNOSIS — E039 Hypothyroidism, unspecified: Secondary | ICD-10-CM

## 2021-04-19 MED ORDER — LEVOTHYROXINE SODIUM 137 MCG PO TABS: ORAL_TABLET | ORAL | 3 refills | Status: DC

## 2021-04-19 NOTE — Progress Notes (Signed)
Patient has been advised of labs and results. Noted 6 - 8 week lab recheck.

## 2021-04-19 NOTE — Telephone Encounter (Signed)
Pt is extremely upset about being called multiples time for medicare wellness. She said she has declined multiple times and is tired of [place] calling her and that it is ridiculous that we will not stop. I told her I would let someone know to see if there was a way to remove her from the list.

## 2021-04-24 NOTE — Telephone Encounter (Signed)
We can see if Glori Bickers can remove her and we can have Thurston Hole take her off her list as well.

## 2021-04-27 DIAGNOSIS — M0609 Rheumatoid arthritis without rheumatoid factor, multiple sites: Secondary | ICD-10-CM | POA: Diagnosis not present

## 2021-05-21 ENCOUNTER — Other Ambulatory Visit: Payer: Self-pay | Admitting: Family Medicine

## 2021-05-25 DIAGNOSIS — M0609 Rheumatoid arthritis without rheumatoid factor, multiple sites: Secondary | ICD-10-CM | POA: Diagnosis not present

## 2021-06-14 DIAGNOSIS — M0609 Rheumatoid arthritis without rheumatoid factor, multiple sites: Secondary | ICD-10-CM | POA: Diagnosis not present

## 2021-06-14 DIAGNOSIS — M8580 Other specified disorders of bone density and structure, unspecified site: Secondary | ICD-10-CM | POA: Diagnosis not present

## 2021-06-14 DIAGNOSIS — E559 Vitamin D deficiency, unspecified: Secondary | ICD-10-CM | POA: Diagnosis not present

## 2021-06-14 DIAGNOSIS — E538 Deficiency of other specified B group vitamins: Secondary | ICD-10-CM | POA: Diagnosis not present

## 2021-06-14 DIAGNOSIS — M150 Primary generalized (osteo)arthritis: Secondary | ICD-10-CM | POA: Diagnosis not present

## 2021-06-14 DIAGNOSIS — M797 Fibromyalgia: Secondary | ICD-10-CM | POA: Diagnosis not present

## 2021-06-14 DIAGNOSIS — R6889 Other general symptoms and signs: Secondary | ICD-10-CM | POA: Diagnosis not present

## 2021-06-14 DIAGNOSIS — Z6841 Body Mass Index (BMI) 40.0 and over, adult: Secondary | ICD-10-CM | POA: Diagnosis not present

## 2021-06-14 DIAGNOSIS — R5383 Other fatigue: Secondary | ICD-10-CM | POA: Diagnosis not present

## 2021-06-22 DIAGNOSIS — M0609 Rheumatoid arthritis without rheumatoid factor, multiple sites: Secondary | ICD-10-CM | POA: Diagnosis not present

## 2021-07-20 DIAGNOSIS — M0609 Rheumatoid arthritis without rheumatoid factor, multiple sites: Secondary | ICD-10-CM | POA: Diagnosis not present

## 2021-08-17 DIAGNOSIS — M0609 Rheumatoid arthritis without rheumatoid factor, multiple sites: Secondary | ICD-10-CM | POA: Diagnosis not present

## 2021-08-25 ENCOUNTER — Other Ambulatory Visit: Payer: Self-pay | Admitting: Family Medicine

## 2021-08-28 NOTE — Telephone Encounter (Signed)
Please contact patient for TSH labs. Orders have been placed. Sending 30 day refill. No additional without updated labs for levels.  Thanks

## 2021-08-28 NOTE — Telephone Encounter (Signed)
Patient has been contacted about having to get TSH labs done prior to any further refills of meds and that lab orders are ready. AM

## 2021-09-14 DIAGNOSIS — M0609 Rheumatoid arthritis without rheumatoid factor, multiple sites: Secondary | ICD-10-CM | POA: Diagnosis not present

## 2021-09-17 DIAGNOSIS — E039 Hypothyroidism, unspecified: Secondary | ICD-10-CM | POA: Diagnosis not present

## 2021-09-18 LAB — TSH: TSH: 3.09 mIU/L (ref 0.40–4.50)

## 2021-09-27 DIAGNOSIS — Z6841 Body Mass Index (BMI) 40.0 and over, adult: Secondary | ICD-10-CM | POA: Diagnosis not present

## 2021-09-27 DIAGNOSIS — M150 Primary generalized (osteo)arthritis: Secondary | ICD-10-CM | POA: Diagnosis not present

## 2021-09-27 DIAGNOSIS — M0609 Rheumatoid arthritis without rheumatoid factor, multiple sites: Secondary | ICD-10-CM | POA: Diagnosis not present

## 2021-09-27 DIAGNOSIS — M797 Fibromyalgia: Secondary | ICD-10-CM | POA: Diagnosis not present

## 2021-09-27 DIAGNOSIS — M8580 Other specified disorders of bone density and structure, unspecified site: Secondary | ICD-10-CM | POA: Diagnosis not present

## 2021-09-27 DIAGNOSIS — R5383 Other fatigue: Secondary | ICD-10-CM | POA: Diagnosis not present

## 2021-09-27 DIAGNOSIS — E559 Vitamin D deficiency, unspecified: Secondary | ICD-10-CM | POA: Diagnosis not present

## 2021-10-07 ENCOUNTER — Other Ambulatory Visit: Payer: Self-pay | Admitting: Family Medicine

## 2021-10-12 DIAGNOSIS — M0609 Rheumatoid arthritis without rheumatoid factor, multiple sites: Secondary | ICD-10-CM | POA: Diagnosis not present

## 2021-11-09 DIAGNOSIS — M0609 Rheumatoid arthritis without rheumatoid factor, multiple sites: Secondary | ICD-10-CM | POA: Diagnosis not present

## 2021-12-07 DIAGNOSIS — M0609 Rheumatoid arthritis without rheumatoid factor, multiple sites: Secondary | ICD-10-CM | POA: Diagnosis not present

## 2022-01-04 DIAGNOSIS — M0609 Rheumatoid arthritis without rheumatoid factor, multiple sites: Secondary | ICD-10-CM | POA: Diagnosis not present

## 2022-01-14 DIAGNOSIS — E669 Obesity, unspecified: Secondary | ICD-10-CM | POA: Diagnosis not present

## 2022-01-14 DIAGNOSIS — R5383 Other fatigue: Secondary | ICD-10-CM | POA: Diagnosis not present

## 2022-01-14 DIAGNOSIS — M0609 Rheumatoid arthritis without rheumatoid factor, multiple sites: Secondary | ICD-10-CM | POA: Diagnosis not present

## 2022-01-14 DIAGNOSIS — E559 Vitamin D deficiency, unspecified: Secondary | ICD-10-CM | POA: Diagnosis not present

## 2022-01-14 DIAGNOSIS — M797 Fibromyalgia: Secondary | ICD-10-CM | POA: Diagnosis not present

## 2022-01-14 DIAGNOSIS — M8580 Other specified disorders of bone density and structure, unspecified site: Secondary | ICD-10-CM | POA: Diagnosis not present

## 2022-01-14 DIAGNOSIS — M150 Primary generalized (osteo)arthritis: Secondary | ICD-10-CM | POA: Diagnosis not present

## 2022-01-14 DIAGNOSIS — Z6839 Body mass index (BMI) 39.0-39.9, adult: Secondary | ICD-10-CM | POA: Diagnosis not present

## 2022-02-06 DIAGNOSIS — R5383 Other fatigue: Secondary | ICD-10-CM | POA: Diagnosis not present

## 2022-02-06 DIAGNOSIS — Z79899 Other long term (current) drug therapy: Secondary | ICD-10-CM | POA: Diagnosis not present

## 2022-02-06 DIAGNOSIS — M0609 Rheumatoid arthritis without rheumatoid factor, multiple sites: Secondary | ICD-10-CM | POA: Diagnosis not present

## 2022-02-06 DIAGNOSIS — Z111 Encounter for screening for respiratory tuberculosis: Secondary | ICD-10-CM | POA: Diagnosis not present

## 2022-03-07 DIAGNOSIS — M0609 Rheumatoid arthritis without rheumatoid factor, multiple sites: Secondary | ICD-10-CM | POA: Diagnosis not present

## 2022-03-29 ENCOUNTER — Telehealth: Payer: Self-pay | Admitting: Family Medicine

## 2022-03-29 NOTE — Telephone Encounter (Signed)
Patient called to see if an prescription for an Epipen can be written. Its needed for food allergies. Please advise if an appointment is need first

## 2022-04-01 ENCOUNTER — Other Ambulatory Visit: Payer: Self-pay | Admitting: Family Medicine

## 2022-04-01 MED ORDER — EPINEPHRINE 0.3 MG/0.3ML IJ SOAJ: 0.3000 mg | INTRAMUSCULAR | 1 refills | Status: AC | PRN

## 2022-04-01 NOTE — Telephone Encounter (Signed)
Completed.

## 2022-04-05 DIAGNOSIS — Z79899 Other long term (current) drug therapy: Secondary | ICD-10-CM | POA: Diagnosis not present

## 2022-04-05 DIAGNOSIS — M0609 Rheumatoid arthritis without rheumatoid factor, multiple sites: Secondary | ICD-10-CM | POA: Diagnosis not present

## 2022-04-16 DIAGNOSIS — R5383 Other fatigue: Secondary | ICD-10-CM | POA: Diagnosis not present

## 2022-04-16 DIAGNOSIS — M1991 Primary osteoarthritis, unspecified site: Secondary | ICD-10-CM | POA: Diagnosis not present

## 2022-04-16 DIAGNOSIS — M8580 Other specified disorders of bone density and structure, unspecified site: Secondary | ICD-10-CM | POA: Diagnosis not present

## 2022-04-16 DIAGNOSIS — Z6838 Body mass index (BMI) 38.0-38.9, adult: Secondary | ICD-10-CM | POA: Diagnosis not present

## 2022-04-16 DIAGNOSIS — M797 Fibromyalgia: Secondary | ICD-10-CM | POA: Diagnosis not present

## 2022-04-16 DIAGNOSIS — M0609 Rheumatoid arthritis without rheumatoid factor, multiple sites: Secondary | ICD-10-CM | POA: Diagnosis not present

## 2022-04-16 DIAGNOSIS — E669 Obesity, unspecified: Secondary | ICD-10-CM | POA: Diagnosis not present

## 2022-04-16 DIAGNOSIS — E559 Vitamin D deficiency, unspecified: Secondary | ICD-10-CM | POA: Diagnosis not present

## 2022-05-03 DIAGNOSIS — M0609 Rheumatoid arthritis without rheumatoid factor, multiple sites: Secondary | ICD-10-CM | POA: Diagnosis not present

## 2022-06-04 DIAGNOSIS — M0609 Rheumatoid arthritis without rheumatoid factor, multiple sites: Secondary | ICD-10-CM | POA: Diagnosis not present

## 2022-07-03 ENCOUNTER — Encounter: Payer: Self-pay | Admitting: General Practice

## 2022-07-03 DIAGNOSIS — M0609 Rheumatoid arthritis without rheumatoid factor, multiple sites: Secondary | ICD-10-CM | POA: Diagnosis not present

## 2022-07-03 DIAGNOSIS — R5383 Other fatigue: Secondary | ICD-10-CM | POA: Diagnosis not present

## 2022-07-03 DIAGNOSIS — Z79899 Other long term (current) drug therapy: Secondary | ICD-10-CM | POA: Diagnosis not present

## 2022-07-31 DIAGNOSIS — M0609 Rheumatoid arthritis without rheumatoid factor, multiple sites: Secondary | ICD-10-CM | POA: Diagnosis not present

## 2022-08-01 DIAGNOSIS — E559 Vitamin D deficiency, unspecified: Secondary | ICD-10-CM | POA: Diagnosis not present

## 2022-08-01 DIAGNOSIS — M1991 Primary osteoarthritis, unspecified site: Secondary | ICD-10-CM | POA: Diagnosis not present

## 2022-08-01 DIAGNOSIS — R5383 Other fatigue: Secondary | ICD-10-CM | POA: Diagnosis not present

## 2022-08-01 DIAGNOSIS — Z6838 Body mass index (BMI) 38.0-38.9, adult: Secondary | ICD-10-CM | POA: Diagnosis not present

## 2022-08-01 DIAGNOSIS — E669 Obesity, unspecified: Secondary | ICD-10-CM | POA: Diagnosis not present

## 2022-08-01 DIAGNOSIS — M0609 Rheumatoid arthritis without rheumatoid factor, multiple sites: Secondary | ICD-10-CM | POA: Diagnosis not present

## 2022-08-01 DIAGNOSIS — M797 Fibromyalgia: Secondary | ICD-10-CM | POA: Diagnosis not present

## 2022-08-01 DIAGNOSIS — M8580 Other specified disorders of bone density and structure, unspecified site: Secondary | ICD-10-CM | POA: Diagnosis not present

## 2022-08-13 DIAGNOSIS — M47816 Spondylosis without myelopathy or radiculopathy, lumbar region: Secondary | ICD-10-CM | POA: Diagnosis not present

## 2022-08-13 DIAGNOSIS — M797 Fibromyalgia: Secondary | ICD-10-CM | POA: Diagnosis not present

## 2022-08-13 DIAGNOSIS — Z79891 Long term (current) use of opiate analgesic: Secondary | ICD-10-CM | POA: Diagnosis not present

## 2022-08-13 DIAGNOSIS — M069 Rheumatoid arthritis, unspecified: Secondary | ICD-10-CM | POA: Diagnosis not present

## 2022-08-13 DIAGNOSIS — G894 Chronic pain syndrome: Secondary | ICD-10-CM | POA: Diagnosis not present

## 2022-08-13 DIAGNOSIS — M2550 Pain in unspecified joint: Secondary | ICD-10-CM | POA: Diagnosis not present

## 2022-08-28 DIAGNOSIS — M0609 Rheumatoid arthritis without rheumatoid factor, multiple sites: Secondary | ICD-10-CM | POA: Diagnosis not present

## 2022-09-09 DIAGNOSIS — Z23 Encounter for immunization: Secondary | ICD-10-CM | POA: Diagnosis not present

## 2022-09-25 DIAGNOSIS — M0609 Rheumatoid arthritis without rheumatoid factor, multiple sites: Secondary | ICD-10-CM | POA: Diagnosis not present

## 2022-09-30 DIAGNOSIS — M069 Rheumatoid arthritis, unspecified: Secondary | ICD-10-CM | POA: Diagnosis not present

## 2022-09-30 DIAGNOSIS — M797 Fibromyalgia: Secondary | ICD-10-CM | POA: Diagnosis not present

## 2022-09-30 DIAGNOSIS — G894 Chronic pain syndrome: Secondary | ICD-10-CM | POA: Diagnosis not present

## 2022-09-30 DIAGNOSIS — M2550 Pain in unspecified joint: Secondary | ICD-10-CM | POA: Diagnosis not present

## 2022-10-19 ENCOUNTER — Other Ambulatory Visit: Payer: Self-pay | Admitting: Family Medicine

## 2022-10-19 DIAGNOSIS — E039 Hypothyroidism, unspecified: Secondary | ICD-10-CM

## 2022-10-21 NOTE — Telephone Encounter (Signed)
Please contact the patient to schedule appt. Last seen 12/06/2020. Sending 30 day med refill.

## 2022-10-22 NOTE — Telephone Encounter (Signed)
Scheduled for 10/31/22 @ 9:10. Tvt

## 2022-10-23 DIAGNOSIS — M0609 Rheumatoid arthritis without rheumatoid factor, multiple sites: Secondary | ICD-10-CM | POA: Diagnosis not present

## 2022-10-31 ENCOUNTER — Ambulatory Visit (INDEPENDENT_AMBULATORY_CARE_PROVIDER_SITE_OTHER): Payer: Medicare Other | Admitting: Family Medicine

## 2022-10-31 ENCOUNTER — Encounter: Payer: Self-pay | Admitting: Family Medicine

## 2022-10-31 VITALS — BP 132/84 | HR 101 | Ht 64.0 in | Wt 221.0 lb

## 2022-10-31 DIAGNOSIS — M797 Fibromyalgia: Secondary | ICD-10-CM | POA: Diagnosis not present

## 2022-10-31 DIAGNOSIS — E039 Hypothyroidism, unspecified: Secondary | ICD-10-CM

## 2022-10-31 DIAGNOSIS — E559 Vitamin D deficiency, unspecified: Secondary | ICD-10-CM

## 2022-10-31 DIAGNOSIS — E538 Deficiency of other specified B group vitamins: Secondary | ICD-10-CM | POA: Diagnosis not present

## 2022-10-31 DIAGNOSIS — M069 Rheumatoid arthritis, unspecified: Secondary | ICD-10-CM

## 2022-10-31 NOTE — Progress Notes (Signed)
Christina Ayala - 71 y.o. female MRN [id number]  Date of birth: [birth year]  Subjective Chief Complaint  Patient presents with   Medication Refill    HPI Christina Ayala is a 71 y.o. female here today for follow up visit.   She continues to see rheumatology for RA.  Remains on plaquenil and leflunomide. Also with coexisting fibromyalgia.  She has seen pain management for this and lyrica was added recently.  She reports this has been life changing for control of her pain.  She is also taking 1/3 of norco 5/325 as needed for pain control.    Feels good with current strength of levothyroxine.  She is taking daily.   Remains on trazodone for insomnia.  This is working well for her.    ROS:  A comprehensive ROS was completed and negative except as noted per HPI  Allergies  Allergen Reactions   Other Anaphylaxis    portabella and cremini mushrooms.  Peppers (Any kind)   Percocet [Oxycodone-Acetaminophen] Other (See Comments)    Mood changes, vision changes.    Eggs Or Egg-Derived Products    Erythromycin Base Nausea Only   Oxytetracycline Nausea And Vomiting   Penicillins Nausea And Vomiting    Can take keflex   Spinach     Past Medical History:  Diagnosis Date   Fibromyalgia    Rheumatoid arthritis (HCC)    Rheumatoid arthritis (HCC)    Thyroid disease     Past Surgical History:  Procedure Laterality Date   ABDOMINAL HYSTERECTOMY     CHOLECYSTECTOMY     LEFT HEART CATH AND CORONARY ANGIOGRAPHY N/A 12/02/2017   Procedure: LEFT HEART CATH AND CORONARY ANGIOGRAPHY;  Surgeon: Marykay Lex, MD;  Location: [facility] INVASIVE CV LAB;  Service: Cardiovascular;  Laterality: N/A;   REPLACEMENT TOTAL KNEE Right    TONSILLECTOMY     TYMPANOSTOMY TUBE PLACEMENT      Social History   Socioeconomic History   Marital status: Married    Spouse name: Not on file   Number of children: Not on file   Years of education: Not on file   Highest education level: Not on file  Occupational History    Not on file  Tobacco Use   Smoking status: Never   Smokeless tobacco: Never  Substance and Sexual Activity   Alcohol use: Yes    Alcohol/week: 0.0 standard drinks of alcohol   Drug use: No   Sexual activity: Not Currently  Other Topics Concern   Not on file  Social History Narrative   Not on file   Social Determinants of Health   Financial Resource Strain: Not on file  Food Insecurity: Not on file  Transportation Needs: Not on file  Physical Activity: Not on file  Stress: Not on file  Social Connections: Not on file    Family History  Problem Relation Age of Onset   Depression Brother    Diabetes Maternal Uncle    Diabetes Paternal Aunt     Health Maintenance  Topic Date Due   Medicare Annual Wellness (AWV)  Never done   COVID-19 Vaccine (4 - 2023-24 season) 11/04/2022   Pneumonia Vaccine 60+ Years old (2 - PPSV23 or PCV20) 11/01/2023 (Originally 09/03/2017)   COLONOSCOPY (Pts 45-49yrs Insurance coverage will need to be confirmed)  11/01/2023 (Originally 09/12/2018)   MAMMOGRAM  11/11/2048 (Originally 04/21/2001)   DTaP/Tdap/Td (2 - Td or Tdap) 09/12/2025   DEXA SCAN  Completed   Hepatitis C Screening  Completed  HPV VACCINES  Aged Out   Zoster Vaccines- Shingrix  Discontinued     ----------------------------------------------------------------------------------------------------------------------------------------------------------------------------------------------------------------- Physical Exam BP 132/84 (BP Location: Left Wrist, Patient Position: Sitting, Cuff Size: Normal)   Pulse (!) 101   Ht 5\' 4"  (1.626 m)   Wt 221 lb (100.2 kg)   SpO2 95%   BMI 37.93 kg/m   Physical Exam Constitutional:      Appearance: Normal appearance.  HENT:     Head: Normocephalic and atraumatic.  Eyes:     General: No scleral icterus. Cardiovascular:     Rate and Rhythm: Normal rate and regular rhythm.  Pulmonary:     Effort: Pulmonary effort is normal.     Breath  sounds: Normal breath sounds.  Musculoskeletal:     Cervical back: Neck supple.  Neurological:     Mental Status: She is alert.  Psychiatric:        Mood and Affect: Mood normal.        Behavior: Behavior normal.     ------------------------------------------------------------------------------------------------------------------------------------------------------------------------------------------------------------------- Assessment and Plan  Hypothyroidism Doing well with current strength of levothyroxine. Updating TSH.    Rheumatoid arthritis (HCC) She is seeing rheumatology for management stable at this time.   Fibromyalgia Lyrica recently added by pain management.  This is working quite well for her.  Also has norco, using sparingly.     No orders of the defined types were placed in this encounter.   Return for Schedule Annual Medicare Wellness Visit.    This visit occurred during the SARS-CoV-2 public health emergency.  Safety protocols were in place, including screening questions prior to the visit, additional usage of staff PPE, and extensive cleaning of exam room while observing appropriate contact time as indicated for disinfecting solutions.

## 2022-10-31 NOTE — Assessment & Plan Note (Signed)
Lyrica recently added by pain management.  This is working quite well for her.  Also has norco, using sparingly.

## 2022-10-31 NOTE — Assessment & Plan Note (Signed)
She is seeing rheumatology for management stable at this time.

## 2022-10-31 NOTE — Assessment & Plan Note (Signed)
Doing well with current strength of levothyroxine. Updating TSH.

## 2022-11-01 LAB — CBC WITH DIFFERENTIAL/PLATELET
Absolute Monocytes: 681 cells/uL (ref 200–950)
Basophils Absolute: 69 cells/uL (ref 0–200)
Basophils Relative: 1.4 %
Eosinophils Absolute: 372 cells/uL (ref 15–500)
Eosinophils Relative: 7.6 %
HCT: 38.5 % (ref 35.0–45.0)
Hemoglobin: 12.9 g/dL (ref 11.7–15.5)
Lymphs Abs: 1622 cells/uL (ref 850–3900)
MCH: 33.3 pg — ABNORMAL HIGH (ref 27.0–33.0)
MCHC: 33.5 g/dL (ref 32.0–36.0)
MCV: 99.5 fL (ref 80.0–100.0)
MPV: 12.5 fL (ref 7.5–12.5)
Monocytes Relative: 13.9 %
Neutro Abs: 2156 cells/uL (ref 1500–7800)
Neutrophils Relative %: 44 %
Platelets: 167 10*3/uL (ref 140–400)
RBC: 3.87 10*6/uL (ref 3.80–5.10)
RDW: 11.7 % (ref 11.0–15.0)
Total Lymphocyte: 33.1 %
WBC: 4.9 10*3/uL (ref 3.8–10.8)

## 2022-11-01 LAB — COMPLETE METABOLIC PANEL WITH GFR
AG Ratio: 1.6 (calc) (ref 1.0–2.5)
ALT: 17 U/L (ref 6–29)
AST: 22 U/L (ref 10–35)
Albumin: 3.6 g/dL (ref 3.6–5.1)
Alkaline phosphatase (APISO): 84 U/L (ref 37–153)
BUN: 17 mg/dL (ref 7–25)
CO2: 27 mmol/L (ref 20–32)
Calcium: 8.7 mg/dL (ref 8.6–10.4)
Chloride: 107 mmol/L (ref 98–110)
Creat: 0.84 mg/dL (ref 0.60–1.00)
Globulin: 2.3 g/dL (calc) (ref 1.9–3.7)
Glucose, Bld: 102 mg/dL — ABNORMAL HIGH (ref 65–99)
Potassium: 3.9 mmol/L (ref 3.5–5.3)
Sodium: 142 mmol/L (ref 135–146)
Total Bilirubin: 0.3 mg/dL (ref 0.2–1.2)
Total Protein: 5.9 g/dL — ABNORMAL LOW (ref 6.1–8.1)
eGFR: 74 mL/min/{1.73_m2} (ref >=60)

## 2022-11-01 LAB — VITAMIN D 25 HYDROXY (VIT D DEFICIENCY, FRACTURES): Vit D, 25-Hydroxy: 47 ng/mL (ref 30–100)

## 2022-11-01 LAB — TSH: TSH: 6.89 mIU/L — ABNORMAL HIGH (ref 0.40–4.50)

## 2022-11-01 LAB — VITAMIN B12: Vitamin B-12: 556 pg/mL (ref 200–1100)

## 2022-11-04 ENCOUNTER — Other Ambulatory Visit: Payer: Self-pay | Admitting: Family Medicine

## 2022-11-04 DIAGNOSIS — E039 Hypothyroidism, unspecified: Secondary | ICD-10-CM

## 2022-11-04 MED ORDER — LEVOTHYROXINE SODIUM 175 MCG PO TABS: ORAL_TABLET | ORAL | 1 refills | Status: DC

## 2022-11-20 DIAGNOSIS — M0609 Rheumatoid arthritis without rheumatoid factor, multiple sites: Secondary | ICD-10-CM | POA: Diagnosis not present

## 2022-12-18 DIAGNOSIS — M0609 Rheumatoid arthritis without rheumatoid factor, multiple sites: Secondary | ICD-10-CM | POA: Diagnosis not present

## 2022-12-31 DIAGNOSIS — M797 Fibromyalgia: Secondary | ICD-10-CM | POA: Diagnosis not present

## 2022-12-31 DIAGNOSIS — M2550 Pain in unspecified joint: Secondary | ICD-10-CM | POA: Diagnosis not present

## 2022-12-31 DIAGNOSIS — Z79891 Long term (current) use of opiate analgesic: Secondary | ICD-10-CM | POA: Diagnosis not present

## 2022-12-31 DIAGNOSIS — M069 Rheumatoid arthritis, unspecified: Secondary | ICD-10-CM | POA: Diagnosis not present

## 2022-12-31 DIAGNOSIS — G894 Chronic pain syndrome: Secondary | ICD-10-CM | POA: Diagnosis not present

## 2022-12-31 DIAGNOSIS — M25512 Pain in left shoulder: Secondary | ICD-10-CM | POA: Diagnosis not present

## 2022-12-31 DIAGNOSIS — Z1330 Encounter for screening examination for mental health and behavioral disorders, unspecified: Secondary | ICD-10-CM | POA: Diagnosis not present

## 2023-01-08 DIAGNOSIS — M9901 Segmental and somatic dysfunction of cervical region: Secondary | ICD-10-CM | POA: Diagnosis not present

## 2023-01-08 DIAGNOSIS — M542 Cervicalgia: Secondary | ICD-10-CM | POA: Diagnosis not present

## 2023-01-08 DIAGNOSIS — M9902 Segmental and somatic dysfunction of thoracic region: Secondary | ICD-10-CM | POA: Diagnosis not present

## 2023-01-08 DIAGNOSIS — M5413 Radiculopathy, cervicothoracic region: Secondary | ICD-10-CM | POA: Diagnosis not present

## 2023-01-12 ENCOUNTER — Ambulatory Visit
Admission: EM | Admit: 2023-01-12 | Discharge: 2023-01-12 | Disposition: A | Discharge status: Home / Self Care | Payer: Medicare Other | Attending: Family Medicine | Admitting: Family Medicine

## 2023-01-12 ENCOUNTER — Encounter: Payer: Self-pay | Admitting: Emergency Medicine

## 2023-01-12 ENCOUNTER — Ambulatory Visit (INDEPENDENT_AMBULATORY_CARE_PROVIDER_SITE_OTHER): Payer: Medicare Other

## 2023-01-12 DIAGNOSIS — M542 Cervicalgia: Secondary | ICD-10-CM

## 2023-01-12 DIAGNOSIS — M5412 Radiculopathy, cervical region: Secondary | ICD-10-CM

## 2023-01-12 DIAGNOSIS — M25511 Pain in right shoulder: Secondary | ICD-10-CM

## 2023-01-12 DIAGNOSIS — M19011 Primary osteoarthritis, right shoulder: Secondary | ICD-10-CM | POA: Diagnosis not present

## 2023-01-12 MED ORDER — PREDNISONE 10 MG (21) PO TBPK: ORAL_TABLET | Freq: Every day | ORAL | 0 refills | Status: DC

## 2023-01-12 NOTE — ED Provider Notes (Signed)
[facility]    CSN: [id number] Arrival date & time: 01/12/23  1125      History   Chief Complaint Chief Complaint  Patient presents with   Shoulder Pain    right    HPI Christina Ayala is a 72 y.o. female.   HPI  Patient has chronic pain from fibromyalgia and rheumatoid arthritis.  States that she dropped an object onto the floor, and got down on the floor to pick it up.  She was supporting herself to get up off of the floor by putting 1 hand on a couch and 1 hand on a stool.  The stool rolled away under her right arm and she twisted and fell.  Had pain in both shoulders.  Initially her left shoulder hurt more.  Her right shoulder is more painful today.  She has pain in the right shoulder area, the right neck, and down her arm some pain into her fingers.  No history of shoulder problems in the past.  Has arthritis "all over".  No known cervical disc disease.  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   Exertional dyspnea - As Anginal Equivalent 11/20/2017   Asymptomatic PVCs 10/21/2017   Prolonged Q-T interval on ECG 10/21/2017   Thoracic aortic atherosclerosis ([place]) 10/24/2016   Poor balance 09/03/2016   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   Vitamin D deficiency 09/03/2016   Left knee pain 04/02/2016   High risk medications (not anticoagulants) long-term use 01/15/2016   Osteopenia 09/15/2015   Morbid obesity ([place]) 09/13/2015   Post-menopausal 09/13/2015   Rheumatoid arthritis ([place]) 09/13/2015   Fibromyalgia 09/13/2015   History of hysterectomy 09/13/2015   Vitamin B12 deficiency 09/13/2015   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      Home Medications    Prior to Admission medications   Medication Sig Start Date End Date Taking? Authorizing Provider  predniSONE (STERAPRED UNI-PAK 21 TAB) 10 MG (21) TBPK tablet Take by mouth daily. Take 6 tabs by mouth daily  for 2 days, then 5 tabs for 2 days, then 4 tabs for 2 days, then 3 tabs for 2 days, 2 tabs for 2 days, then 1 tab by mouth daily for 2 days 01/12/23  Yes Raylene Everts, MD  certolizumab pegol (CIMZIA) 2 X 200 MG KIT '400mg'$  Subcutaneous once a month for 30 days    [provider]  diclofenac sodium (VOLTAREN) 1 % GEL Apply 4 g topically 4 (four) times daily. To affected joint. 01/28/18   Gregor Hams, MD  EPINEPHrine 0.3 mg/0.3 mL IJ Darden Palmer  injection Inject 0.3 mg into the muscle as needed for anaphylaxis. 04/01/22   Luetta Nutting, DO  folic acid (FOLVITE) 1 MG tablet Take 1 mg by mouth daily.     [provider]  HYDROcodone-acetaminophen (NORCO/VICODIN) 5-325 MG tablet Take 0.5 tablets by mouth.    [provider]  hydroxychloroquine (PLAQUENIL) 200 MG tablet Take 200 mg by mouth 2 (two) times daily.     [provider]  leflunomide (ARAVA) 20 MG tablet Take 20 mg by mouth daily.    [provider]  levothyroxine (SYNTHROID) 175 MCG tablet TAKE 1 TABLET BY MOUTH EVERY DAY BEFORE BREAKFAST 11/04/22   Luetta Nutting, DO  Multiple Vitamins-Minerals (MULTIVITAMIN WITH MINERALS) tablet Take 1 tablet by mouth.     [provider]  pregabalin (LYRICA) 25 MG capsule PLEASE SEE ATTACHED FOR DETAILED DIRECTIONS    [provider]  traZODone (DESYREL) 100 MG tablet Take 100 mg by mouth as needed for sleep.     [provider]    Family History Family History  Problem Relation Age of Onset   Emphysema Father    Depression Brother    Diabetes Maternal Uncle    Diabetes Paternal Aunt     Social History Social History   Tobacco Use   Smoking status: Never   Smokeless tobacco: Never  Vaping Use   Vaping Use: Never used  Substance Use Topics   Alcohol use: Yes    Alcohol/week: 0.0 standard drinks of alcohol   Drug use: No     Allergies   Other, Percocet [oxycodone-acetaminophen], Eggs or egg-derived products, Erythromycin base, Oxytetracycline, Penicillins, and Spinach   Review of Systems Review of Systems  See HPI Physical Exam Triage Vital Signs ED Triage Vitals  Enc Vitals Group     BP 01/12/23 1243 (!) 160/91     Pulse Rate 01/12/23 1243 86     Resp 01/12/23 1243 20     Temp 01/12/23 1243 98.7 F (37.1 C)     Temp Source 01/12/23 1243 Oral     SpO2 01/12/23 1243 95 %     Weight 01/12/23 1244 211 lb (95.7 kg)     Height 01/12/23 1244 '5\' 3"'$  (1.6 m)     Head Circumference --      Peak Flow --      Pain Score 01/12/23 1244 10     Pain Loc --      Pain Edu? --      Excl. in [place]? --    No data found.  Updated Vital Signs BP (!) 160/91 (BP Location: Right Wrist)   Pulse 86   Temp 98.7 F (37.1 C) (Oral)   Resp 20   Ht '5\' 3"'$  (1.6 m)   Wt 95.7 kg   SpO2 95%   BMI 37.38 kg/m      Physical Exam Constitutional:      General: She is in acute distress.     Appearance: She is well-developed. She is obese.     Comments: Patient is uncomfortable.  Guarded movements.  Holds neck in flexed position  HENT:     Head: Normocephalic and atraumatic.  Eyes:     Conjunctiva/sclera: Conjunctivae normal.     Pupils: Pupils are equal, round, and reactive  to light.  Cardiovascular:     Rate and Rhythm: Normal rate.  Pulmonary:     Effort: Pulmonary effort is normal. No respiratory distress.  Abdominal:     General: There is no  distension.     Palpations: Abdomen is soft.  Musculoskeletal:        General: Tenderness present. Normal range of motion.     Cervical back: Normal range of motion.     Comments: Patient has limited range of motion in her neck secondary to pain.  Tenderness in the paraspinous muscles and right trapezius muscle.  No tenderness from the shoulder joint, good motion in the shoulder.  No obvious neurodeficit in upper extremities  Skin:    General: Skin is warm and dry.  Neurological:     Mental Status: She is alert.     Gait: Gait abnormal.      UC Treatments / Results  Labs (all labs ordered are listed, but only abnormal results are displayed) Labs Reviewed - No data to display  EKG   Radiology DG Cervical Spine Complete  Result Date: 01/12/2023 CLINICAL DATA:  Neck and shoulder pain fell 2 weeks ago EXAM: CERVICAL SPINE - COMPLETE 4+ VIEW COMPARISON:  None Available. FINDINGS: There is no evidence of cervical spine fracture or prevertebral soft tissue swelling. Straightening of the cervical spine. No other significant bone abnormalities are identified. IMPRESSION: Straightening of the cervical spine, which may be due to positioning or muscle spasm. Electronically Signed   By: Keane Police D.O.   On: 01/12/2023 13:42   DG Shoulder Right  Result Date: 01/12/2023 CLINICAL DATA:  Fall 2 weeks ago. Burning sensation and pain in right shoulder. EXAM: RIGHT SHOULDER - 2+ VIEW COMPARISON:  None Available. FINDINGS: There is no acute fracture or dislocation. Bony alignment is normal. There is mild degenerative change about the glenohumeral joint. The acromioclavicular joint space is preserved. The soft tissues are unremarkable. IMPRESSION: No evidence of acute injury in the shoulder. Mild degenerative change about the  glenohumeral joint. Electronically Signed   By: Valetta Mole M.D.   On: 01/12/2023 13:41    Procedures Procedures (including critical care time)  Medications Ordered in UC Medications - No data to display  Initial Impression / Assessment and Plan / UC Course  I have reviewed the triage vital signs and the nursing notes.  Pertinent labs & imaging results that were available during my care of the patient were reviewed by me and considered in my medical decision making (see chart for details).     Muscular right neck and shoulder pain from strain.  Patient has hydrocodone at home.  She has Lyrica.  She also takes Advil as needed.  Is told she may continue these.  Will add a steroid pack for the radicular pain.  Ice or heat recommended.  Follow-up with physical therapy as scheduled. Final Clinical Impressions(s) / UC Diagnoses   Final diagnoses:  Acute pain of right shoulder  Musculoskeletal neck pain  Cervical radiculitis     Discharge Instructions      Take the prednisone as directed. You are taking pregabalin once a day.  Consider taking this at bedtime for better sleep Increase hydrocodone for better pain relief. Use ice or heat to painful neck muscles See your doctor if not improving by next week   ED Prescriptions     Medication Sig Dispense Auth. Provider   predniSONE (STERAPRED UNI-PAK 21 TAB) 10 MG (21) TBPK tablet Take by mouth daily. Take 6 tabs by mouth daily  for 2 days, then 5 tabs for 2 days, then 4 tabs for 2 days, then 3 tabs for 2 days, 2 tabs for 2 days, then 1 tab by mouth  daily for 2 days 42 tablet Raylene Everts, MD      PDMP not reviewed this encounter.   Raylene Everts, MD 01/12/23 [phone]

## 2023-01-12 NOTE — Discharge Instructions (Signed)
Take the prednisone as directed. You are taking pregabalin once a day.  Consider taking this at bedtime for better sleep Increase hydrocodone for better pain relief. Use ice or heat to painful neck muscles See your doctor if not improving by next week

## 2023-01-12 NOTE — ED Triage Notes (Addendum)
Right shoulder pain x 2 weeks Pt was pushing her self up from the floor and pain  To right shoulder Pt had a dislocated left shoulder at that time from the same incident  Pain is 10/10 Ibuprofen pta Here w/ daughter

## 2023-01-15 ENCOUNTER — Ambulatory Visit (INDEPENDENT_AMBULATORY_CARE_PROVIDER_SITE_OTHER): Payer: Medicare Other

## 2023-01-15 ENCOUNTER — Other Ambulatory Visit: Payer: Self-pay | Admitting: Family Medicine

## 2023-01-15 ENCOUNTER — Ambulatory Visit (INDEPENDENT_AMBULATORY_CARE_PROVIDER_SITE_OTHER): Payer: Medicare Other | Admitting: Family Medicine

## 2023-01-15 ENCOUNTER — Ambulatory Visit: Payer: Self-pay

## 2023-01-15 VITALS — BP 138/86 | HR 105 | Ht 63.0 in | Wt 220.0 lb

## 2023-01-15 DIAGNOSIS — G8929 Other chronic pain: Secondary | ICD-10-CM

## 2023-01-15 DIAGNOSIS — Z79899 Other long term (current) drug therapy: Secondary | ICD-10-CM | POA: Diagnosis not present

## 2023-01-15 DIAGNOSIS — M0609 Rheumatoid arthritis without rheumatoid factor, multiple sites: Secondary | ICD-10-CM | POA: Diagnosis not present

## 2023-01-15 DIAGNOSIS — M5010 Cervical disc disorder with radiculopathy, unspecified cervical region: Secondary | ICD-10-CM | POA: Diagnosis not present

## 2023-01-15 DIAGNOSIS — R5383 Other fatigue: Secondary | ICD-10-CM | POA: Diagnosis not present

## 2023-01-15 DIAGNOSIS — M546 Pain in thoracic spine: Secondary | ICD-10-CM

## 2023-01-15 DIAGNOSIS — M25511 Pain in right shoulder: Secondary | ICD-10-CM | POA: Diagnosis not present

## 2023-01-15 NOTE — Progress Notes (Signed)
I, Peterson Lombard, LAT, ATC acting as a scribe for Lynne Leader, MD.  Subjective:    CC: R shoulder pain  HPI: Pt is a 72 y/o female c/o R shoulder pain x 3 wks. MOI: She dropped something on the floor and got down on the floor to pick it up. When trying to get up, she was using the couch and a stool for assistance, and the stool rolled away from her. Pt Pt was seen at the [facility] on 01/12/23 for this issue. Pt has a hx of fibromyalgia, rheumatoid arthritis and is currently being seen by pain management. Pt locates pain to around R scapula and into the R upper arm.   Neck pain: yes- R-side Radiates: yes- into upper arm UE Numbness/tingling: yes- R fingers UE Weakness: yes Aggravates: any motions Treatments tried: prednisone, Voltaren, ice, IBU,   Dx imaging: 01/12/23 R shoulder & c-spine XR  Pertinent review of Systems: No fevers or chills.  Relevant historical information: Rheumatoid arthritis   Objective:    Vitals:   01/15/23 1432  BP: 138/86  Pulse: (!) 105  SpO2: 95%   General: Well Developed, well nourished, and in no acute distress.   MSK: C-spine: Decreased cervical motion.  Nontender to palpation spinal midline. Upper extremity strength decreased triceps extension 4/5.  Reflexes are intact. Sensation is intact throughout.  T-spine: Nontender palpation spinal midline.  Tender palpation right thoracic paraspinal musculature. Decreased thoracic motion pain with rotation and extension.   Lab and Radiology Results  Procedure: Real-time Ultrasound Guided trigger point injection right thoracic paraspinal musculature rhomboid region 2 areas of tenderness. Device: Philips Affiniti 50G Images permanently stored and available for review in PACS Verbal informed consent obtained.  Discussed risks and benefits of procedure. Warned about infection, bleeding, hyperglycemia damage to structures pneumothorax among others. Patient expresses understanding and  agreement Time-out conducted.   Noted no overlying erythema, induration, or other signs of local infection.   Skin prepped in a sterile fashion.   Local anesthesia: Topical Ethyl chloride.   With sterile technique and under real time ultrasound guidance: 20 mg of Kenalog and 1 mL of lidocaine  injected into 2 trigger points on right thoracic paraspinal musculature area of tenderness.  The needle was visualized with the ultrasound the entire time and care was taken to avoid deep injection near the ribs or pleura.  Fluid seen entering the paraspinal musculature..   Completed without difficulty   Pain minimally resolved suggesting accurate placement of the medication.   Advised to call if fevers/chills, erythema, induration, drainage, or persistent bleeding.   Images permanently stored and available for review in the ultrasound unit.  Impression: Technically successful ultrasound guided injection.      No results found for this or any previous visit (from the past 72 hour(s)). DG Cervical Spine Complete  Result Date: 01/12/2023 CLINICAL DATA:  Neck and shoulder pain fell 2 weeks ago EXAM: CERVICAL SPINE - COMPLETE 4+ VIEW COMPARISON:  None Available. FINDINGS: There is no evidence of cervical spine fracture or prevertebral soft tissue swelling. Straightening of the cervical spine. No other significant bone abnormalities are identified. IMPRESSION: Straightening of the cervical spine, which may be due to positioning or muscle spasm. Electronically Signed   By: Keane Police D.O.   On: 01/12/2023 13:42   DG Shoulder Right  Result Date: 01/12/2023 CLINICAL DATA:  Fall 2 weeks ago. Burning sensation and pain in right shoulder. EXAM: RIGHT SHOULDER - 2+ VIEW COMPARISON:  None Available.  FINDINGS: There is no acute fracture or dislocation. Bony alignment is normal. There is mild degenerative change about the glenohumeral joint. The acromioclavicular joint space is preserved. The soft tissues are  unremarkable. IMPRESSION: No evidence of acute injury in the shoulder. Mild degenerative change about the glenohumeral joint. Electronically Signed   By: Valetta Mole M.D.   On: 01/12/2023 13:41    X-ray images T-spine obtained today personally independently interpreted. Image quality reduced due to positioning and body habitus.  Questionable possible compression fracture versus overlying rib.   Await radiology overread.   Impression and Recommendations:    Assessment and Plan: 72 y.o. female with right arm pain thought to be cervical radiculopathy at C7.  This pain is severe and not improving and is in fact worsening over the last several days.  She was seen in urgent care for this on March 3 where x-rays C-spine were obtained.  She has been treated with prednisone which has not helped and is currently taking Lyrica which is not helping.  She is experiencing weakness.  Plan for MRI cervical spine to further evaluate the new right cervical radiculopathy.  Additional she is experiencing pain in her thoracic paraspinal musculature area on the right side.  This may be referred pain from the neck pain. Differential includes muscle dysfunction and spasm.  There could be a compression fracture as well.  I cannot tell entirely on my read of the thoracic spine x-rays.  I am awaiting radiology overread.  Assuming no compression fractures visible muscle spasm and dysfunction is certainly a possibility.  Trigger point injections performed today which helped a little.  Physical therapy may be helpful.  Await radiology overread.  PDMP not reviewed this encounter. Orders Placed This Encounter  Procedures   [place] LIMITED JOINT SPACE STRUCTURES UP RIGHT(NO LINKED CHARGES)    Order Specific Question:   Reason for Exam (SYMPTOM  OR DIAGNOSIS REQUIRED)    Answer:   right shoulder pain    Order Specific Question:   Preferred imaging location?    Answer:   [place]   MR CERVICAL SPINE WO  CONTRAST    Standing Status:   Future    Standing Expiration Date:   02/15/2023    Order Specific Question:   What is the patient's sedation requirement?    Answer:   No Sedation    Order Specific Question:   Does the patient have a pacemaker or implanted devices?    Answer:   No    Order Specific Question:   Preferred imaging location?    Answer:   [facility]process development [facility] (table limit-350lbs)   No orders of the defined types were placed in this encounter.   Discussed warning signs or symptoms. Please see discharge instructions. Patient expresses understanding.   The above documentation has been reviewed and is accurate and complete Lynne Leader, M.D.

## 2023-01-15 NOTE — Patient Instructions (Addendum)
Thank you for coming in today.   You received an injection today. Seek immediate medical attention if the joint becomes red, extremely painful, or is oozing fluid.   Please get an Xray today before you leave   You should hear from MRI scheduling within 1 week. If you do not hear please let me know.    Check back after MRI

## 2023-01-20 NOTE — Progress Notes (Signed)
Thoracic spine x-ray shows mild arthritis changes.  No fracture is visible.

## 2023-01-29 DIAGNOSIS — E559 Vitamin D deficiency, unspecified: Secondary | ICD-10-CM | POA: Diagnosis not present

## 2023-01-29 DIAGNOSIS — Z6838 Body mass index (BMI) 38.0-38.9, adult: Secondary | ICD-10-CM | POA: Diagnosis not present

## 2023-01-29 DIAGNOSIS — M8580 Other specified disorders of bone density and structure, unspecified site: Secondary | ICD-10-CM | POA: Diagnosis not present

## 2023-01-29 DIAGNOSIS — M1991 Primary osteoarthritis, unspecified site: Secondary | ICD-10-CM | POA: Diagnosis not present

## 2023-01-29 DIAGNOSIS — M0609 Rheumatoid arthritis without rheumatoid factor, multiple sites: Secondary | ICD-10-CM | POA: Diagnosis not present

## 2023-01-29 DIAGNOSIS — E669 Obesity, unspecified: Secondary | ICD-10-CM | POA: Diagnosis not present

## 2023-01-29 DIAGNOSIS — R5383 Other fatigue: Secondary | ICD-10-CM | POA: Diagnosis not present

## 2023-01-29 DIAGNOSIS — M797 Fibromyalgia: Secondary | ICD-10-CM | POA: Diagnosis not present

## 2023-02-01 ENCOUNTER — Ambulatory Visit (INDEPENDENT_AMBULATORY_CARE_PROVIDER_SITE_OTHER): Payer: Medicare Other

## 2023-02-01 DIAGNOSIS — M5010 Cervical disc disorder with radiculopathy, unspecified cervical region: Secondary | ICD-10-CM

## 2023-02-01 DIAGNOSIS — M5412 Radiculopathy, cervical region: Secondary | ICD-10-CM | POA: Diagnosis not present

## 2023-02-01 DIAGNOSIS — M4802 Spinal stenosis, cervical region: Secondary | ICD-10-CM | POA: Diagnosis not present

## 2023-02-04 DIAGNOSIS — H26491 Other secondary cataract, right eye: Secondary | ICD-10-CM | POA: Diagnosis not present

## 2023-02-04 DIAGNOSIS — H43313 Vitreous membranes and strands, bilateral: Secondary | ICD-10-CM | POA: Diagnosis not present

## 2023-02-04 DIAGNOSIS — Z961 Presence of intraocular lens: Secondary | ICD-10-CM | POA: Diagnosis not present

## 2023-02-10 NOTE — Progress Notes (Signed)
MRI cervical spine shows areas where the nerves could be pinched on both sides at at the C6-C7 disc area this would be the C7 nerve root.  Next step may be neck injection.  Would you like me to order that now?

## 2023-02-11 ENCOUNTER — Telehealth: Payer: Self-pay

## 2023-02-11 DIAGNOSIS — M5010 Cervical disc disorder with radiculopathy, unspecified cervical region: Secondary | ICD-10-CM

## 2023-02-11 NOTE — Telephone Encounter (Signed)
MRI cervical spine shows areas where the nerves could be pinched on both sides at at the C6-C7 disc area this would be the C7 nerve root.  Next step may be neck injection.  Would you like me to order that now?  Written by Gregor Hams, MD on 02/10/2023  6:38 AM EDT Seen by patient Christina Ayala on 02/10/2023  7:53 AM

## 2023-02-11 NOTE — Telephone Encounter (Signed)
Called and spoke with patient regarding MRI results. Pt is agreeable to injection for her neck.   Forwarding to Dr. Georgina Snell to confirm order.

## 2023-02-12 DIAGNOSIS — M0609 Rheumatoid arthritis without rheumatoid factor, multiple sites: Secondary | ICD-10-CM | POA: Diagnosis not present

## 2023-02-12 NOTE — Telephone Encounter (Signed)
Epidural steroid injection ordered 

## 2023-02-12 NOTE — Addendum Note (Signed)
Addended by: Gregor Hams on: 02/12/2023 07:02 AM   Modules accepted: Orders

## 2023-03-10 DIAGNOSIS — M9902 Segmental and somatic dysfunction of thoracic region: Secondary | ICD-10-CM | POA: Diagnosis not present

## 2023-03-10 DIAGNOSIS — M542 Cervicalgia: Secondary | ICD-10-CM | POA: Diagnosis not present

## 2023-03-10 DIAGNOSIS — M9901 Segmental and somatic dysfunction of cervical region: Secondary | ICD-10-CM | POA: Diagnosis not present

## 2023-03-10 DIAGNOSIS — M5413 Radiculopathy, cervicothoracic region: Secondary | ICD-10-CM | POA: Diagnosis not present

## 2023-03-12 DIAGNOSIS — M0609 Rheumatoid arthritis without rheumatoid factor, multiple sites: Secondary | ICD-10-CM | POA: Diagnosis not present

## 2023-04-01 DIAGNOSIS — G894 Chronic pain syndrome: Secondary | ICD-10-CM | POA: Diagnosis not present

## 2023-04-01 DIAGNOSIS — M797 Fibromyalgia: Secondary | ICD-10-CM | POA: Diagnosis not present

## 2023-04-01 DIAGNOSIS — M069 Rheumatoid arthritis, unspecified: Secondary | ICD-10-CM | POA: Diagnosis not present

## 2023-04-01 DIAGNOSIS — M2550 Pain in unspecified joint: Secondary | ICD-10-CM | POA: Diagnosis not present

## 2023-04-09 DIAGNOSIS — M0609 Rheumatoid arthritis without rheumatoid factor, multiple sites: Secondary | ICD-10-CM | POA: Diagnosis not present

## 2023-05-02 DIAGNOSIS — M9902 Segmental and somatic dysfunction of thoracic region: Secondary | ICD-10-CM | POA: Diagnosis not present

## 2023-05-02 DIAGNOSIS — M9901 Segmental and somatic dysfunction of cervical region: Secondary | ICD-10-CM | POA: Diagnosis not present

## 2023-05-02 DIAGNOSIS — M5413 Radiculopathy, cervicothoracic region: Secondary | ICD-10-CM | POA: Diagnosis not present

## 2023-05-02 DIAGNOSIS — M542 Cervicalgia: Secondary | ICD-10-CM | POA: Diagnosis not present

## 2023-05-07 DIAGNOSIS — M0609 Rheumatoid arthritis without rheumatoid factor, multiple sites: Secondary | ICD-10-CM | POA: Diagnosis not present

## 2023-05-16 ENCOUNTER — Other Ambulatory Visit: Payer: Self-pay | Admitting: Family Medicine

## 2023-05-16 DIAGNOSIS — E039 Hypothyroidism, unspecified: Secondary | ICD-10-CM

## 2023-06-04 DIAGNOSIS — M0609 Rheumatoid arthritis without rheumatoid factor, multiple sites: Secondary | ICD-10-CM | POA: Diagnosis not present

## 2023-07-02 DIAGNOSIS — M069 Rheumatoid arthritis, unspecified: Secondary | ICD-10-CM | POA: Diagnosis not present

## 2023-07-02 DIAGNOSIS — G894 Chronic pain syndrome: Secondary | ICD-10-CM | POA: Diagnosis not present

## 2023-07-02 DIAGNOSIS — M797 Fibromyalgia: Secondary | ICD-10-CM | POA: Diagnosis not present

## 2023-07-02 DIAGNOSIS — Z79891 Long term (current) use of opiate analgesic: Secondary | ICD-10-CM | POA: Diagnosis not present

## 2023-07-02 DIAGNOSIS — M2550 Pain in unspecified joint: Secondary | ICD-10-CM | POA: Diagnosis not present

## 2023-07-03 DIAGNOSIS — M0609 Rheumatoid arthritis without rheumatoid factor, multiple sites: Secondary | ICD-10-CM | POA: Diagnosis not present

## 2023-07-30 DIAGNOSIS — R5383 Other fatigue: Secondary | ICD-10-CM | POA: Diagnosis not present

## 2023-07-30 DIAGNOSIS — Z6838 Body mass index (BMI) 38.0-38.9, adult: Secondary | ICD-10-CM | POA: Diagnosis not present

## 2023-07-30 DIAGNOSIS — M1991 Primary osteoarthritis, unspecified site: Secondary | ICD-10-CM | POA: Diagnosis not present

## 2023-07-30 DIAGNOSIS — M797 Fibromyalgia: Secondary | ICD-10-CM | POA: Diagnosis not present

## 2023-07-30 DIAGNOSIS — M0609 Rheumatoid arthritis without rheumatoid factor, multiple sites: Secondary | ICD-10-CM | POA: Diagnosis not present

## 2023-07-30 DIAGNOSIS — E669 Obesity, unspecified: Secondary | ICD-10-CM | POA: Diagnosis not present

## 2023-07-30 DIAGNOSIS — M8580 Other specified disorders of bone density and structure, unspecified site: Secondary | ICD-10-CM | POA: Diagnosis not present

## 2023-07-30 DIAGNOSIS — E559 Vitamin D deficiency, unspecified: Secondary | ICD-10-CM | POA: Diagnosis not present

## 2023-07-31 DIAGNOSIS — M0609 Rheumatoid arthritis without rheumatoid factor, multiple sites: Secondary | ICD-10-CM | POA: Diagnosis not present

## 2023-08-19 DIAGNOSIS — M9901 Segmental and somatic dysfunction of cervical region: Secondary | ICD-10-CM | POA: Diagnosis not present

## 2023-08-19 DIAGNOSIS — M542 Cervicalgia: Secondary | ICD-10-CM | POA: Diagnosis not present

## 2023-08-19 DIAGNOSIS — M5413 Radiculopathy, cervicothoracic region: Secondary | ICD-10-CM | POA: Diagnosis not present

## 2023-08-19 DIAGNOSIS — M9902 Segmental and somatic dysfunction of thoracic region: Secondary | ICD-10-CM | POA: Diagnosis not present

## 2023-08-28 DIAGNOSIS — Z79899 Other long term (current) drug therapy: Secondary | ICD-10-CM | POA: Diagnosis not present

## 2023-08-28 DIAGNOSIS — M0609 Rheumatoid arthritis without rheumatoid factor, multiple sites: Secondary | ICD-10-CM | POA: Diagnosis not present

## 2023-09-01 ENCOUNTER — Other Ambulatory Visit: Payer: Self-pay

## 2023-09-01 ENCOUNTER — Ambulatory Visit
Admission: EM | Admit: 2023-09-01 | Discharge: 2023-09-01 | Disposition: A | Discharge status: Home / Self Care | Payer: Medicare Other | Attending: Family Medicine | Admitting: Family Medicine

## 2023-09-01 ENCOUNTER — Ambulatory Visit (INDEPENDENT_AMBULATORY_CARE_PROVIDER_SITE_OTHER): Payer: Medicare Other

## 2023-09-01 DIAGNOSIS — M25532 Pain in left wrist: Secondary | ICD-10-CM | POA: Diagnosis not present

## 2023-09-01 DIAGNOSIS — S62102A Fracture of unspecified carpal bone, left wrist, initial encounter for closed fracture: Secondary | ICD-10-CM

## 2023-09-01 DIAGNOSIS — S52572A Other intraarticular fracture of lower end of left radius, initial encounter for closed fracture: Secondary | ICD-10-CM | POA: Diagnosis not present

## 2023-09-01 NOTE — ED Triage Notes (Signed)
Got up in the middle of the night, tripped and fell, landing on left arm. Has c/o left wrist pain, has bruising.

## 2023-09-01 NOTE — ED Provider Notes (Signed)
[facility]    CSN: [id number] Arrival date & time: 09/01/23  4098      History   Chief Complaint No chief complaint on file.   HPI Christina Ayala is a 72 y.o. female.   HPI  The patient is here today for evaluation of her left wrist.  She fell in her home yesterday.  It is swollen, discolored, and painful.  It has a slight angulation.  She feels certain it is fractured.  Is brought in by her husband for care.  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   Vitamin D deficiency 09/03/2016   Left knee pain 04/02/2016   High risk medications (not anticoagulants) long-term use 01/15/2016   Osteopenia 09/15/2015   Morbid obesity (HCC) 09/13/2015   Post-menopausal 09/13/2015   Rheumatoid arthritis (HCC) 09/13/2015   Fibromyalgia 09/13/2015   History of hysterectomy 09/13/2015   Vitamin B12 deficiency 09/13/2015   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      Home Medications    Prior to Admission medications   Medication Sig Start Date End Date Taking? Authorizing Provider  methylPREDNISolone (MEDROL) 2 MG tablet Take 2 mg by mouth daily.   Yes [provider]  certolizumab pegol (CIMZIA) 2 X 200 MG KIT 400mg  Subcutaneous once a month for 30 days    [provider]  diclofenac sodium (VOLTAREN) 1 % GEL Apply 4 g topically 4 (four) times daily. To affected joint. 01/28/18   Rodolph Bong, MD  EPINEPHrine 0.3 mg/0.3 mL IJ SOAJ injection Inject 0.3 mg into the muscle as needed for anaphylaxis. 04/01/22   Everrett Coombe, DO  folic acid (FOLVITE) 1 MG tablet Take 1 mg by mouth daily.     [provider]  leflunomide (ARAVA) 20 MG tablet Take 20 mg by mouth daily.    [provider]  levothyroxine (SYNTHROID) 175 MCG tablet TAKE 1 TABLET BY MOUTH EVERY DAY BEFORE BREAKFAST 05/16/23   Everrett Coombe, DO  Multiple Vitamins-Minerals (MULTIVITAMIN WITH MINERALS) tablet Take 1 tablet by mouth.    [provider]  pregabalin (LYRICA) 25 MG capsule PLEASE SEE ATTACHED FOR DETAILED DIRECTIONS    [provider]  traZODone (DESYREL) 100 MG tablet Take 100 mg by mouth as needed for sleep.     [provider]    Family History  Family History  Problem Relation Age of Onset   Emphysema Father    Depression Brother    Diabetes Maternal Uncle    Diabetes Paternal Aunt     Social History Social History   Tobacco Use   Smoking status: Never   Smokeless tobacco: Never  Vaping Use   Vaping status: Never Used  Substance Use Topics   Alcohol use: Yes    Alcohol/week: 0.0 standard drinks of alcohol   Drug use: No     Allergies   Other, Percocet [oxycodone-acetaminophen], Codeine, Egg-derived products, Erythromycin base, Oxytetracycline, Penicillins, and Spinach   Review of Systems Review of Systems See  HPI  Physical Exam Triage Vital Signs ED Triage Vitals  Encounter Vitals Group     BP 09/01/23 0900 (!) 158/95     Systolic BP Percentile --      Diastolic BP Percentile --      Pulse Rate 09/01/23 0900 94     Resp 09/01/23 0900 16     Temp 09/01/23 0900 97.7 F (36.5 C)     Temp Source 09/01/23 0900 Oral     SpO2 09/01/23 0900 99 %     Weight --      Height --      Head Circumference --      Peak Flow --      Pain Score 09/01/23 0902 8     Pain Loc --      Pain Education --      Exclude from Growth Chart --    No data found.  Updated Vital Signs BP (!) 158/95 (BP Location: Right Arm)   Pulse 94   Temp 97.7 F (36.5 C) (Oral)   Resp 16   SpO2 99%     Physical Exam Constitutional:      General: She is not in acute distress.    Appearance: She is well-developed. She is obese.     Comments: Patient is in wheelchair  HENT:     Head: Normocephalic and atraumatic.  Eyes:     Conjunctiva/sclera: Conjunctivae normal.     Pupils: Pupils are equal, round, and reactive to light.  Cardiovascular:     Rate and Rhythm: Normal rate.  Pulmonary:     Effort: Pulmonary effort is normal. No respiratory distress.  Abdominal:     General: There is no distension.     Palpations: Abdomen is soft.  Musculoskeletal:        General: Swelling, tenderness, deformity and signs of injury present. Normal range of motion.     Cervical back: Normal range of motion.     Comments: Left wrist has angulation at the distal radius/ulna with soft tissue swelling and discoloration  Skin:    General: Skin is warm and dry.  Neurological:     General: No focal deficit present.     Mental Status: She is alert.      UC Treatments / Results  Labs (all labs ordered are listed, but only abnormal results are displayed) Labs Reviewed - No data to display  EKG   Radiology DG Wrist Complete Left  Result Date: 09/01/2023 CLINICAL DATA:  Fall.  Left wrist pain. EXAM: LEFT WRIST - COMPLETE 3+  VIEW COMPARISON:  None Available. FINDINGS: There is an impacted, intra-articular fracture of the distal radius with mild dorsal angulation. A 1 mm curvilinear density along the distal most aspect of the ulna may reflect an age-indeterminate fracture or soft tissue calcification. There is no dislocation. Soft  tissue swelling is noted about the wrist. IMPRESSION: 1. Impacted fracture of the distal radius. 2. Possible tiny fracture of the distal ulna. Electronically Signed   By: Sebastian Ache M.D.   On: 09/01/2023 11:34    Procedures Procedures (including critical care time)  Medications Ordered in UC Medications - No data to display  Initial Impression / Assessment and Plan / UC Course  I have reviewed the triage vital signs and the nursing notes.  Pertinent labs & imaging results that were available during my care of the patient were reviewed by me and considered in my medical decision making (see chart for details).     I discussed the fracture with Dr. Benjamin Stain in sports medicine.  He advises me that this will need an orthopedic for reduction and possible surgery. Final Clinical Impressions(s) / UC Diagnoses   Final diagnoses:  Closed fracture of left wrist, initial encounter     Discharge Instructions      Go to [facility] now Dr Ashley Jacobs are on call and are awaiting your arrival   ED Prescriptions   None    PDMP not reviewed this encounter.   Eustace Moore, MD 09/01/23 [phone]

## 2023-09-01 NOTE — Discharge Instructions (Signed)
Go to [facility] now Dr Ashley Jacobs are on call and are awaiting your arrival

## 2023-09-02 DIAGNOSIS — S52572A Other intraarticular fracture of lower end of left radius, initial encounter for closed fracture: Secondary | ICD-10-CM | POA: Diagnosis not present

## 2023-09-09 DIAGNOSIS — S52572A Other intraarticular fracture of lower end of left radius, initial encounter for closed fracture: Secondary | ICD-10-CM | POA: Diagnosis not present

## 2023-09-10 DIAGNOSIS — S52572A Other intraarticular fracture of lower end of left radius, initial encounter for closed fracture: Secondary | ICD-10-CM | POA: Diagnosis not present

## 2023-09-10 DIAGNOSIS — Y939 Activity, unspecified: Secondary | ICD-10-CM | POA: Diagnosis not present

## 2023-09-10 DIAGNOSIS — Y999 Unspecified external cause status: Secondary | ICD-10-CM | POA: Diagnosis not present

## 2023-09-10 DIAGNOSIS — G8918 Other acute postprocedural pain: Secondary | ICD-10-CM | POA: Diagnosis not present

## 2023-09-22 DIAGNOSIS — S52502D Unspecified fracture of the lower end of left radius, subsequent encounter for closed fracture with routine healing: Secondary | ICD-10-CM | POA: Diagnosis not present

## 2023-09-22 DIAGNOSIS — M25532 Pain in left wrist: Secondary | ICD-10-CM | POA: Diagnosis not present

## 2023-09-22 DIAGNOSIS — M25632 Stiffness of left wrist, not elsewhere classified: Secondary | ICD-10-CM | POA: Diagnosis not present

## 2023-09-23 DIAGNOSIS — M25532 Pain in left wrist: Secondary | ICD-10-CM | POA: Diagnosis not present

## 2023-09-23 DIAGNOSIS — M25632 Stiffness of left wrist, not elsewhere classified: Secondary | ICD-10-CM | POA: Diagnosis not present

## 2023-09-23 DIAGNOSIS — S52502D Unspecified fracture of the lower end of left radius, subsequent encounter for closed fracture with routine healing: Secondary | ICD-10-CM | POA: Diagnosis not present

## 2023-09-23 DIAGNOSIS — S52572D Other intraarticular fracture of lower end of left radius, subsequent encounter for closed fracture with routine healing: Secondary | ICD-10-CM | POA: Diagnosis not present

## 2023-09-29 DIAGNOSIS — M0609 Rheumatoid arthritis without rheumatoid factor, multiple sites: Secondary | ICD-10-CM | POA: Diagnosis not present

## 2023-09-30 DIAGNOSIS — M25532 Pain in left wrist: Secondary | ICD-10-CM | POA: Diagnosis not present

## 2023-09-30 DIAGNOSIS — M25632 Stiffness of left wrist, not elsewhere classified: Secondary | ICD-10-CM | POA: Diagnosis not present

## 2023-09-30 DIAGNOSIS — S52502D Unspecified fracture of the lower end of left radius, subsequent encounter for closed fracture with routine healing: Secondary | ICD-10-CM | POA: Diagnosis not present

## 2023-10-01 DIAGNOSIS — M069 Rheumatoid arthritis, unspecified: Secondary | ICD-10-CM | POA: Diagnosis not present

## 2023-10-01 DIAGNOSIS — G894 Chronic pain syndrome: Secondary | ICD-10-CM | POA: Diagnosis not present

## 2023-10-01 DIAGNOSIS — M797 Fibromyalgia: Secondary | ICD-10-CM | POA: Diagnosis not present

## 2023-10-01 DIAGNOSIS — M2550 Pain in unspecified joint: Secondary | ICD-10-CM | POA: Diagnosis not present

## 2023-10-06 DIAGNOSIS — S52502D Unspecified fracture of the lower end of left radius, subsequent encounter for closed fracture with routine healing: Secondary | ICD-10-CM | POA: Diagnosis not present

## 2023-10-06 DIAGNOSIS — M25632 Stiffness of left wrist, not elsewhere classified: Secondary | ICD-10-CM | POA: Diagnosis not present

## 2023-10-06 DIAGNOSIS — M25532 Pain in left wrist: Secondary | ICD-10-CM | POA: Diagnosis not present

## 2023-10-15 DIAGNOSIS — M25532 Pain in left wrist: Secondary | ICD-10-CM | POA: Diagnosis not present

## 2023-10-15 DIAGNOSIS — S52502D Unspecified fracture of the lower end of left radius, subsequent encounter for closed fracture with routine healing: Secondary | ICD-10-CM | POA: Diagnosis not present

## 2023-10-15 DIAGNOSIS — M25632 Stiffness of left wrist, not elsewhere classified: Secondary | ICD-10-CM | POA: Diagnosis not present

## 2023-10-21 DIAGNOSIS — S52572D Other intraarticular fracture of lower end of left radius, subsequent encounter for closed fracture with routine healing: Secondary | ICD-10-CM | POA: Diagnosis not present

## 2023-10-22 DIAGNOSIS — S52502D Unspecified fracture of the lower end of left radius, subsequent encounter for closed fracture with routine healing: Secondary | ICD-10-CM | POA: Diagnosis not present

## 2023-10-22 DIAGNOSIS — M25532 Pain in left wrist: Secondary | ICD-10-CM | POA: Diagnosis not present

## 2023-10-22 DIAGNOSIS — M25632 Stiffness of left wrist, not elsewhere classified: Secondary | ICD-10-CM | POA: Diagnosis not present

## 2023-10-29 DIAGNOSIS — Z79899 Other long term (current) drug therapy: Secondary | ICD-10-CM | POA: Diagnosis not present

## 2023-10-29 DIAGNOSIS — M0609 Rheumatoid arthritis without rheumatoid factor, multiple sites: Secondary | ICD-10-CM | POA: Diagnosis not present

## 2023-11-10 DIAGNOSIS — S52502D Unspecified fracture of the lower end of left radius, subsequent encounter for closed fracture with routine healing: Secondary | ICD-10-CM | POA: Diagnosis not present

## 2023-11-10 DIAGNOSIS — M25632 Stiffness of left wrist, not elsewhere classified: Secondary | ICD-10-CM | POA: Diagnosis not present

## 2023-11-10 DIAGNOSIS — M25532 Pain in left wrist: Secondary | ICD-10-CM | POA: Diagnosis not present

## 2023-11-18 ENCOUNTER — Other Ambulatory Visit: Payer: Self-pay | Admitting: Family Medicine

## 2023-11-18 DIAGNOSIS — E039 Hypothyroidism, unspecified: Secondary | ICD-10-CM

## 2024-06-22 ENCOUNTER — Other Ambulatory Visit: Payer: Self-pay | Admitting: Family Medicine

## 2024-06-22 DIAGNOSIS — E039 Hypothyroidism, unspecified: Secondary | ICD-10-CM

## 2024-08-02 ENCOUNTER — Other Ambulatory Visit: Payer: Self-pay | Admitting: Family Medicine

## 2024-08-02 DIAGNOSIS — E039 Hypothyroidism, unspecified: Secondary | ICD-10-CM

## 2024-08-06 ENCOUNTER — Other Ambulatory Visit: Payer: Self-pay | Admitting: Family Medicine

## 2024-08-06 DIAGNOSIS — E039 Hypothyroidism, unspecified: Secondary | ICD-10-CM

## 2024-08-15 ENCOUNTER — Other Ambulatory Visit: Payer: Self-pay | Admitting: Family Medicine

## 2024-08-15 DIAGNOSIS — E039 Hypothyroidism, unspecified: Secondary | ICD-10-CM

## 2024-08-24 ENCOUNTER — Ambulatory Visit: Attending: Family Medicine

## 2024-08-24 ENCOUNTER — Encounter: Payer: Self-pay | Admitting: Family Medicine

## 2024-08-24 ENCOUNTER — Ambulatory Visit: Admitting: Family Medicine

## 2024-08-24 VITALS — BP 137/89 | HR 93 | Temp 98.1°F | Ht 63.0 in | Wt 224.0 lb

## 2024-08-24 DIAGNOSIS — E538 Deficiency of other specified B group vitamins: Secondary | ICD-10-CM

## 2024-08-24 DIAGNOSIS — M069 Rheumatoid arthritis, unspecified: Secondary | ICD-10-CM

## 2024-08-24 DIAGNOSIS — R002 Palpitations: Secondary | ICD-10-CM | POA: Insufficient documentation

## 2024-08-24 DIAGNOSIS — E039 Hypothyroidism, unspecified: Secondary | ICD-10-CM | POA: Diagnosis not present

## 2024-08-24 DIAGNOSIS — E559 Vitamin D deficiency, unspecified: Secondary | ICD-10-CM | POA: Diagnosis not present

## 2024-08-24 DIAGNOSIS — Z13220 Encounter for screening for lipoid disorders: Secondary | ICD-10-CM

## 2024-08-24 DIAGNOSIS — R2689 Other abnormalities of gait and mobility: Secondary | ICD-10-CM

## 2024-08-24 NOTE — Assessment & Plan Note (Signed)
 Checking B12 levels.

## 2024-08-24 NOTE — Assessment & Plan Note (Signed)
 She is seeing rheumatology for management, stable at this time.

## 2024-08-24 NOTE — Assessment & Plan Note (Signed)
 Seems to have some vertigo issues.  Trial of Epley maneuver to see if this helps.  Additionally, given handout for home strengthening chair exercises.

## 2024-08-24 NOTE — Assessment & Plan Note (Signed)
Doing well with current strength of levothyroxine. Updating TSH.

## 2024-08-24 NOTE — Progress Notes (Unsigned)
 EP to read.

## 2024-08-24 NOTE — Assessment & Plan Note (Signed)
 Updated TSH.  Check CBC.  Zio patch ordered.

## 2024-08-24 NOTE — Progress Notes (Signed)
 Christina Ayala - 73 y.o. female MRN [id number]  Date of birth: 13-Mar-1951  Subjective Chief Complaint  Patient presents with   Hypothyroidism   Medication Refill    HPI Christina Ayala is a 73 y.o. female here today for follow up visit.   She reports that she is doing okay.   She has had some episodes recently of feeling hot and feeling her heart is racing.  This lasted for several minutes.  She does have some mild dyspnea at times with this.  She has not had chest pain or dizziness.  She does admit to a little more stress related to concerns about her husband's health.  She continue to see rheumatology for RA.  Remains on Arava  and cimzia .  Her symptoms are stable overall.  She is taking levothyroxine  daily.  Feels pretty good at current strength.  Due for updated labs.    ROS:  A comprehensive ROS was completed and negative except as noted per HPI  Allergies  Allergen Reactions   Other Anaphylaxis    portabella and cremini mushrooms.  Peppers (Any kind)   Percocet [Oxycodone-Acetaminophen] Other (See Comments)    Mood changes, vision changes.    Codeine    Egg Protein-Containing Drug Products    Erythromycin Base Nausea Only   Oxytetracycline Nausea And Vomiting   Penicillins Nausea And Vomiting    Can take keflex   Spinach     Past Medical History:  Diagnosis Date   Fibromyalgia    Rheumatoid arthritis (HCC)    Rheumatoid arthritis (HCC)    Thyroid  disease     Past Surgical History:  Procedure Laterality Date   ABDOMINAL HYSTERECTOMY     CHOLECYSTECTOMY     LEFT HEART CATH AND CORONARY ANGIOGRAPHY N/A 12/02/2017   Procedure: LEFT HEART CATH AND CORONARY ANGIOGRAPHY;  Surgeon: Anner Alm ORN, MD;  Location: [facility] INVASIVE CV LAB;  Service: Cardiovascular;  Laterality: N/A;   REPLACEMENT TOTAL KNEE Right    TONSILLECTOMY     TYMPANOSTOMY TUBE PLACEMENT      Social History   Socioeconomic History   Marital status: Married    Spouse name: Not on file   Number  of children: Not on file   Years of education: Not on file   Highest education level: Not on file  Occupational History   Not on file  Tobacco Use   Smoking status: Never   Smokeless tobacco: Never  Vaping Use   Vaping status: Never Used  Substance and Sexual Activity   Alcohol use: Yes    Alcohol/week: 0.0 standard drinks of alcohol   Drug use: No   Sexual activity: Not Currently  Other Topics Concern   Not on file  Social History Narrative   Not on file   Social Drivers of Health   Financial Resource Strain: Not on file  Food Insecurity: No Food Insecurity (08/13/2022)   Received from [facility]   Hunger Vital Sign    Within the past 12 months, you worried that your food would run out before you got the money to buy more.: Never true    Within the past 12 months, the food you bought just didn't last and you didn't have money to get more.: Never true  Transportation Needs: Not on file  Physical Activity: Not on file  Stress: Not on file  Social Connections: Unknown (03/25/2022)   Received from [facility]   Social Network    Social Network: Not on file  Family History  Problem Relation Age of Onset   Emphysema Father    Depression Brother    Diabetes Maternal Uncle    Diabetes Paternal Aunt     Health Maintenance  Topic Date Due   Medicare Annual Wellness (AWV)  Never done   Pneumococcal Vaccine: 50+ Years (2 of 2 - PPSV23, PCV20, or PCV21) 10/29/2016   Colonoscopy  09/12/2018   COVID-19 Vaccine (4 - 2025-26 season) 07/12/2024   Mammogram  11/11/2048 (Originally 04/22/1991)   DTaP/Tdap/Td (2 - Td or Tdap) 09/12/2025   DEXA SCAN  Completed   Hepatitis C Screening  Completed   Meningococcal B Vaccine  Aged Out   Zoster Vaccines- Shingrix  Discontinued      ----------------------------------------------------------------------------------------------------------------------------------------------------------------------------------------------------------------- Physical Exam BP 137/89 (BP Location: Left Wrist, Patient Position: Sitting, Cuff Size: Small)   Pulse 93   Temp 98.1 F (36.7 C) (Oral)   Ht 5' 3 (1.6 m)   Wt 224 lb (101.6 kg)   SpO2 98%   BMI 39.68 kg/m   Physical Exam Constitutional:      Appearance: Normal appearance.  Cardiovascular:     Rate and Rhythm: Normal rate and regular rhythm.  Pulmonary:     Effort: Pulmonary effort is normal.     Breath sounds: Normal breath sounds.  Neurological:     General: No focal deficit present.     Mental Status: She is alert.  Psychiatric:        Mood and Affect: Mood normal.        Behavior: Behavior normal.     ------------------------------------------------------------------------------------------------------------------------------------------------------------------------------------------------------------------- Assessment and Plan  Rheumatoid arthritis (HCC) She is seeing rheumatology for management, stable at this time.   Vitamin B12 deficiency Checking B12 levels.  Hypothyroidism Doing well with current strength of levothyroxine . Updating TSH.    Poor balance Seems to have some vertigo issues.  Trial of Epley maneuver to see if this helps.  Additionally, given handout for home strengthening chair exercises.  Palpitations Updated TSH.  Check CBC.  Zio patch ordered.   No orders of the defined types were placed in this encounter.   Return in about 6 months (around 02/22/2025) for f/u lyrica.

## 2024-08-25 LAB — CBC WITH DIFFERENTIAL/PLATELET
Basophils Absolute: 0.1 x10E3/uL (ref 0.0–0.2)
Basos: 1 %
EOS (ABSOLUTE): 0.4 x10E3/uL (ref 0.0–0.4)
Eos: 6 %
Hematocrit: 41 % (ref 34.0–46.6)
Hemoglobin: 13.1 g/dL (ref 11.1–15.9)
Immature Grans (Abs): 0 x10E3/uL (ref 0.0–0.1)
Immature Granulocytes: 0 %
Lymphocytes Absolute: 1.8 x10E3/uL (ref 0.7–3.1)
Lymphs: 28 %
MCH: 32.3 pg (ref 26.6–33.0)
MCHC: 32 g/dL (ref 31.5–35.7)
MCV: 101 fL — ABNORMAL HIGH (ref 79–97)
Monocytes Absolute: 0.9 x10E3/uL (ref 0.1–0.9)
Monocytes: 14 %
Neutrophils Absolute: 3.2 x10E3/uL (ref 1.4–7.0)
Neutrophils: 51 %
Platelets: 175 x10E3/uL (ref 150–450)
RBC: 4.05 x10E6/uL (ref 3.77–5.28)
RDW: 11.8 % (ref 11.7–15.4)
WBC: 6.3 x10E3/uL (ref 3.4–10.8)

## 2024-08-25 LAB — CMP14+EGFR
ALT: 25 IU/L (ref 0–32)
AST: 32 IU/L (ref 0–40)
Albumin: 3.8 g/dL (ref 3.8–4.8)
Alkaline Phosphatase: 88 IU/L (ref 49–135)
BUN/Creatinine Ratio: 22 (ref 12–28)
BUN: 18 mg/dL (ref 8–27)
Bilirubin Total: 0.4 mg/dL (ref 0.0–1.2)
CO2: 21 mmol/L (ref 20–29)
Calcium: 8.8 mg/dL (ref 8.7–10.3)
Chloride: 103 mmol/L (ref 96–106)
Creatinine, Ser: 0.82 mg/dL (ref 0.57–1.00)
Globulin, Total: 2.2 g/dL (ref 1.5–4.5)
Glucose: 87 mg/dL (ref 70–99)
Potassium: 4.7 mmol/L (ref 3.5–5.2)
Sodium: 142 mmol/L (ref 134–144)
Total Protein: 6 g/dL (ref 6.0–8.5)
eGFR: 75 mL/min/1.73 (ref >59)

## 2024-08-25 LAB — LIPID PANEL WITH LDL/HDL RATIO
Cholesterol, Total: 189 mg/dL (ref 100–199)
HDL: 54 mg/dL (ref >39)
LDL Chol Calc (NIH): 102 mg/dL — ABNORMAL HIGH (ref 0–99)
LDL/HDL Ratio: 1.9 ratio (ref 0.0–3.2)
Triglycerides: 193 mg/dL — ABNORMAL HIGH (ref 0–149)
VLDL Cholesterol Cal: 33 mg/dL (ref 5–40)

## 2024-08-25 LAB — TSH: TSH: 1.61 u[IU]/mL (ref 0.450–4.500)

## 2024-08-25 LAB — VITAMIN B12: Vitamin B-12: 855 pg/mL (ref 232–1245)

## 2024-08-25 LAB — VITAMIN D 25 HYDROXY (VIT D DEFICIENCY, FRACTURES): Vit D, 25-Hydroxy: 44.8 ng/mL (ref 30.0–100.0)

## 2024-08-27 ENCOUNTER — Other Ambulatory Visit: Payer: Self-pay | Admitting: Family Medicine

## 2024-08-27 DIAGNOSIS — E039 Hypothyroidism, unspecified: Secondary | ICD-10-CM

## 2024-08-27 MED ORDER — LEVOTHYROXINE SODIUM 175 MCG PO TABS: ORAL_TABLET | ORAL | 3 refills | Status: DC

## 2024-08-27 NOTE — Telephone Encounter (Signed)
 Copied from CRM #[id number]. Topic: Clinical - Medication Refill >> Aug 27, 2024 11:06 AM Delon T wrote: Medication: folic acid  (FOLVITE ) 1 MG tablet levothyroxine  (SYNTHROID ) 175 MCG tablet  Has the patient contacted their pharmacy? Yes (Agent: If no, request that the patient contact the pharmacy for the refill. If patient does not wish to contact the pharmacy document the reason why and proceed with request.) (Agent: If yes, when and what did the pharmacy advise?)  This is the patient's preferred pharmacy:  [phone] - [place], [place] - [address] [place] [address] Phone: [phone] Fax: [phone]  Is this the correct pharmacy for this prescription? Yes If no, delete pharmacy and type the correct one.   Has the prescription been filled recently? Yes  Is the patient out of the medication? Yes  Has the patient been seen for an appointment in the last year OR does the patient have an upcoming appointment? Yes  Can we respond through MyChart? Yes  Agent: Please be advised that Rx refills may take up to 3 business days. We ask that you follow-up with your pharmacy.

## 2024-09-03 ENCOUNTER — Ambulatory Visit: Payer: Self-pay | Admitting: Family Medicine

## 2024-09-16 DIAGNOSIS — R002 Palpitations: Secondary | ICD-10-CM

## 2024-11-16 DIAGNOSIS — E039 Hypothyroidism, unspecified: Secondary | ICD-10-CM
